# Patient Record
Sex: Female | Born: 1965
Health system: Southern US, Community
[De-identification: ages and names within clinical notes are randomized; demographics above are authoritative.]

## PROBLEM LIST (undated history)

## (undated) DIAGNOSIS — L309 Dermatitis, unspecified: Secondary | ICD-10-CM

## (undated) DIAGNOSIS — K219 Gastro-esophageal reflux disease without esophagitis: Secondary | ICD-10-CM

## (undated) DIAGNOSIS — G479 Sleep disorder, unspecified: Secondary | ICD-10-CM

## (undated) DIAGNOSIS — M722 Plantar fascial fibromatosis: Secondary | ICD-10-CM

## (undated) DIAGNOSIS — R002 Palpitations: Secondary | ICD-10-CM

## (undated) DIAGNOSIS — I4949 Other premature depolarization: Secondary | ICD-10-CM

## (undated) DIAGNOSIS — R51 Headache: Secondary | ICD-10-CM

## (undated) DIAGNOSIS — F329 Major depressive disorder, single episode, unspecified: Secondary | ICD-10-CM

## (undated) DIAGNOSIS — K602 Anal fissure, unspecified: Secondary | ICD-10-CM

## (undated) DIAGNOSIS — IMO0002 Reserved for concepts with insufficient information to code with codable children: Secondary | ICD-10-CM

## (undated) DIAGNOSIS — E042 Nontoxic multinodular goiter: Secondary | ICD-10-CM

## (undated) DIAGNOSIS — E039 Hypothyroidism, unspecified: Secondary | ICD-10-CM

## (undated) DIAGNOSIS — K625 Hemorrhage of anus and rectum: Secondary | ICD-10-CM

## (undated) DIAGNOSIS — R0989 Other specified symptoms and signs involving the circulatory and respiratory systems: Secondary | ICD-10-CM

## (undated) DIAGNOSIS — F32A Depression, unspecified: Secondary | ICD-10-CM

## (undated) DIAGNOSIS — J309 Allergic rhinitis, unspecified: Secondary | ICD-10-CM

## (undated) DIAGNOSIS — H5789 Other specified disorders of eye and adnexa: Secondary | ICD-10-CM

## (undated) DIAGNOSIS — R0609 Other forms of dyspnea: Secondary | ICD-10-CM

## (undated) DIAGNOSIS — IMO0001 Reserved for inherently not codable concepts without codable children: Secondary | ICD-10-CM

## (undated) HISTORY — PX: COLONOSCOPY: SHX174

## (undated) HISTORY — DX: Hypothyroidism, unspecified: E03.9

## (undated) HISTORY — PX: NASAL TURBINATE REDUCTION: SHX2072

## (undated) HISTORY — DX: Reserved for inherently not codable concepts without codable children: IMO0001

## (undated) HISTORY — DX: Other premature depolarization: I49.49

## (undated) HISTORY — DX: Depression, unspecified: F32.A

## (undated) HISTORY — DX: Other forms of dyspnea: R06.09

## (undated) HISTORY — DX: Allergic rhinitis, unspecified: J30.9

## (undated) HISTORY — DX: Hemorrhage of anus and rectum: K62.5

## (undated) HISTORY — DX: Plantar fascial fibromatosis: M72.2

## (undated) HISTORY — DX: Palpitations: R00.2

## (undated) HISTORY — DX: Other specified disorders of eye and adnexa: H57.89

## (undated) HISTORY — DX: Other specified symptoms and signs involving the circulatory and respiratory systems: R09.89

## (undated) HISTORY — DX: Dermatitis, unspecified: L30.9

## (undated) HISTORY — DX: Gastro-esophageal reflux disease without esophagitis: K21.9

## (undated) HISTORY — PX: BRONCHOSCOPY: SUR163

## (undated) HISTORY — DX: Anal fissure, unspecified: K60.2

## (undated) HISTORY — DX: Nontoxic multinodular goiter: E04.2

## (undated) HISTORY — DX: Reserved for concepts with insufficient information to code with codable children: IMO0002

## (undated) HISTORY — DX: Sleep disorder, unspecified: G47.9

## (undated) HISTORY — DX: Major depressive disorder, single episode, unspecified: F32.9

## (undated) HISTORY — PX: CARPAL TUNNEL RELEASE: SHX101

## (undated) HISTORY — DX: Headache: R51

---

## 1997-12-22 ENCOUNTER — Other Ambulatory Visit: Admission: RE | Admit: 1997-12-22 | Discharge: 1997-12-22 | Payer: Self-pay | Admitting: Obstetrics and Gynecology

## 1998-05-10 ENCOUNTER — Ambulatory Visit (HOSPITAL_COMMUNITY): Admission: RE | Admit: 1998-05-10 | Discharge: 1998-05-10 | Payer: Self-pay | Admitting: Obstetrics and Gynecology

## 1998-06-18 ENCOUNTER — Inpatient Hospital Stay (HOSPITAL_COMMUNITY): Admission: AD | Admit: 1998-06-18 | Discharge: 1998-06-18 | Payer: Self-pay | Admitting: Obstetrics and Gynecology

## 1998-07-13 ENCOUNTER — Inpatient Hospital Stay (HOSPITAL_COMMUNITY): Admission: AD | Admit: 1998-07-13 | Discharge: 1998-07-16 | Payer: Self-pay | Admitting: Obstetrics and Gynecology

## 1998-07-16 ENCOUNTER — Encounter (HOSPITAL_COMMUNITY): Admission: RE | Admit: 1998-07-16 | Discharge: 1998-10-14 | Payer: Self-pay | Admitting: *Deleted

## 1998-08-23 ENCOUNTER — Other Ambulatory Visit: Admission: RE | Admit: 1998-08-23 | Discharge: 1998-08-23 | Payer: Self-pay | Admitting: Obstetrics and Gynecology

## 1999-09-20 ENCOUNTER — Other Ambulatory Visit: Admission: RE | Admit: 1999-09-20 | Discharge: 1999-09-20 | Payer: Self-pay | Admitting: Obstetrics and Gynecology

## 1999-11-02 ENCOUNTER — Emergency Department (HOSPITAL_COMMUNITY): Admission: EM | Admit: 1999-11-02 | Discharge: 1999-11-02 | Payer: Self-pay | Admitting: Emergency Medicine

## 1999-11-02 ENCOUNTER — Encounter: Payer: Self-pay | Admitting: Emergency Medicine

## 2000-09-08 ENCOUNTER — Other Ambulatory Visit: Admission: RE | Admit: 2000-09-08 | Discharge: 2000-09-08 | Payer: Self-pay | Admitting: Obstetrics and Gynecology

## 2000-09-27 ENCOUNTER — Encounter: Payer: Self-pay | Admitting: Obstetrics & Gynecology

## 2000-09-27 ENCOUNTER — Inpatient Hospital Stay (HOSPITAL_COMMUNITY): Admission: AD | Admit: 2000-09-27 | Discharge: 2000-09-27 | Payer: Self-pay | Admitting: Obstetrics & Gynecology

## 2000-10-13 ENCOUNTER — Other Ambulatory Visit: Admission: RE | Admit: 2000-10-13 | Discharge: 2000-10-13 | Payer: Self-pay | Admitting: Obstetrics and Gynecology

## 2001-09-08 ENCOUNTER — Other Ambulatory Visit: Admission: RE | Admit: 2001-09-08 | Discharge: 2001-09-08 | Payer: Self-pay | Admitting: Obstetrics and Gynecology

## 2002-07-12 ENCOUNTER — Encounter: Admission: RE | Admit: 2002-07-12 | Discharge: 2002-07-12 | Payer: Self-pay | Admitting: Family Medicine

## 2002-07-12 ENCOUNTER — Encounter: Payer: Self-pay | Admitting: Family Medicine

## 2002-09-21 ENCOUNTER — Other Ambulatory Visit: Admission: RE | Admit: 2002-09-21 | Discharge: 2002-09-21 | Payer: Self-pay | Admitting: Obstetrics and Gynecology

## 2005-05-06 ENCOUNTER — Other Ambulatory Visit: Admission: RE | Admit: 2005-05-06 | Discharge: 2005-05-06 | Payer: Self-pay | Admitting: Obstetrics and Gynecology

## 2005-10-04 ENCOUNTER — Emergency Department (HOSPITAL_COMMUNITY): Admission: EM | Admit: 2005-10-04 | Discharge: 2005-10-04 | Payer: Self-pay | Admitting: Emergency Medicine

## 2005-12-02 ENCOUNTER — Ambulatory Visit: Payer: Self-pay | Admitting: Family Medicine

## 2006-03-05 ENCOUNTER — Emergency Department (HOSPITAL_COMMUNITY): Admission: EM | Admit: 2006-03-05 | Discharge: 2006-03-05 | Payer: Self-pay | Admitting: Emergency Medicine

## 2006-07-26 ENCOUNTER — Emergency Department (HOSPITAL_COMMUNITY): Admission: EM | Admit: 2006-07-26 | Discharge: 2006-07-26 | Payer: Self-pay | Admitting: Family Medicine

## 2006-09-10 ENCOUNTER — Emergency Department (HOSPITAL_COMMUNITY): Admission: EM | Admit: 2006-09-10 | Discharge: 2006-09-10 | Payer: Self-pay | Admitting: Family Medicine

## 2007-02-12 ENCOUNTER — Emergency Department (HOSPITAL_COMMUNITY): Admission: EM | Admit: 2007-02-12 | Discharge: 2007-02-12 | Payer: Self-pay | Admitting: Family Medicine

## 2007-03-22 ENCOUNTER — Ambulatory Visit: Payer: Self-pay | Admitting: Internal Medicine

## 2007-03-22 DIAGNOSIS — R002 Palpitations: Secondary | ICD-10-CM

## 2007-03-22 DIAGNOSIS — R29898 Other symptoms and signs involving the musculoskeletal system: Secondary | ICD-10-CM | POA: Insufficient documentation

## 2007-03-22 DIAGNOSIS — R12 Heartburn: Secondary | ICD-10-CM | POA: Insufficient documentation

## 2007-03-22 HISTORY — DX: Palpitations: R00.2

## 2007-03-22 LAB — CONVERTED CEMR LAB
Thyroglobulin Ab: 49.2 (ref 0.0–60.0)
Thyroperoxidase Ab SerPl-aCnc: <25 (ref 0.0–60.0)
Vit D, 1,25-Dihydroxy: 40 (ref 30–89)

## 2007-03-29 LAB — CONVERTED CEMR LAB
ALT: 43 units/L — ABNORMAL HIGH (ref 0–35)
AST: 37 units/L (ref 0–37)
Albumin: 3.9 g/dL (ref 3.5–5.2)
Alkaline Phosphatase: 69 units/L (ref 39–117)
BUN: 10 mg/dL (ref 6–23)
Basophils Absolute: 0 10*3/uL (ref 0.0–0.1)
Basophils Relative: 0.1 % (ref 0.0–1.0)
Bilirubin, Direct: 0.1 mg/dL (ref 0.0–0.3)
CO2: 32 meq/L (ref 19–32)
Calcium: 9.8 mg/dL (ref 8.4–10.5)
Chloride: 109 meq/L (ref 96–112)
Cholesterol: 184 mg/dL (ref 0–200)
Creatinine, Ser: 0.7 mg/dL (ref 0.4–1.2)
Eosinophils Absolute: 0.1 10*3/uL (ref 0.0–0.6)
Eosinophils Relative: 2 % (ref 0.0–5.0)
Free T4: 0.8 ng/dL (ref 0.6–1.6)
GFR calc Af Amer: 118 mL/min
GFR calc non Af Amer: 98 mL/min
Glucose, Bld: 90 mg/dL (ref 70–99)
HCT: 41.7 % (ref 36.0–46.0)
HDL: 58.7 mg/dL (ref >39.0)
Hemoglobin: 14.5 g/dL (ref 12.0–15.0)
LDL Cholesterol: 121 mg/dL — ABNORMAL HIGH (ref 0–99)
Lymphocytes Relative: 42.4 % (ref 12.0–46.0)
MCHC: 34.7 g/dL (ref 30.0–36.0)
MCV: 88.2 fL (ref 78.0–100.0)
Monocytes Absolute: 0.6 10*3/uL (ref 0.2–0.7)
Monocytes Relative: 9.4 % (ref 3.0–11.0)
Neutro Abs: 2.7 10*3/uL (ref 1.4–7.7)
Neutrophils Relative %: 46.1 % (ref 43.0–77.0)
Platelets: 370 10*3/uL (ref 150–400)
Potassium: 5.1 meq/L (ref 3.5–5.1)
RBC: 4.73 M/uL (ref 3.87–5.11)
RDW: 12.1 % (ref 11.5–14.6)
Sodium: 146 meq/L — ABNORMAL HIGH (ref 135–145)
TSH: 0.28 microintl units/mL — ABNORMAL LOW (ref 0.35–5.50)
Total Bilirubin: 0.7 mg/dL (ref 0.3–1.2)
Total CHOL/HDL Ratio: 3.1
Total Protein: 6.8 g/dL (ref 6.0–8.3)
Triglycerides: 21 mg/dL (ref 0–149)
VLDL: 4 mg/dL (ref 0–40)
WBC: 5.9 10*3/uL (ref 4.5–10.5)

## 2007-04-12 ENCOUNTER — Encounter: Payer: Self-pay | Admitting: Internal Medicine

## 2007-04-12 ENCOUNTER — Ambulatory Visit: Payer: Self-pay | Admitting: Cardiology

## 2007-07-30 ENCOUNTER — Ambulatory Visit: Payer: Self-pay | Admitting: Internal Medicine

## 2007-07-30 DIAGNOSIS — F341 Dysthymic disorder: Secondary | ICD-10-CM | POA: Insufficient documentation

## 2007-08-06 LAB — CONVERTED CEMR LAB
Free T4: 1.12 ng/dL (ref 0.89–1.80)
TSH: 0.283 microintl units/mL — ABNORMAL LOW (ref 0.350–5.50)

## 2007-08-16 ENCOUNTER — Ambulatory Visit: Payer: Self-pay | Admitting: Endocrinology

## 2007-08-16 DIAGNOSIS — E042 Nontoxic multinodular goiter: Secondary | ICD-10-CM | POA: Insufficient documentation

## 2007-08-16 DIAGNOSIS — F329 Major depressive disorder, single episode, unspecified: Secondary | ICD-10-CM | POA: Insufficient documentation

## 2007-08-23 ENCOUNTER — Encounter: Admission: RE | Admit: 2007-08-23 | Discharge: 2007-08-23 | Payer: Self-pay | Admitting: Endocrinology

## 2007-08-26 ENCOUNTER — Telehealth: Payer: Self-pay | Admitting: Endocrinology

## 2007-08-30 ENCOUNTER — Encounter: Admission: RE | Admit: 2007-08-30 | Discharge: 2007-08-30 | Payer: Self-pay | Admitting: Endocrinology

## 2007-09-01 ENCOUNTER — Encounter: Payer: Self-pay | Admitting: Internal Medicine

## 2007-09-01 ENCOUNTER — Encounter: Payer: Self-pay | Admitting: Endocrinology

## 2007-09-01 ENCOUNTER — Other Ambulatory Visit: Admission: RE | Admit: 2007-09-01 | Discharge: 2007-09-01 | Payer: Self-pay | Admitting: Interventional Radiology

## 2007-09-01 ENCOUNTER — Encounter: Admission: RE | Admit: 2007-09-01 | Discharge: 2007-09-01 | Payer: Self-pay | Admitting: Endocrinology

## 2007-09-01 ENCOUNTER — Encounter (INDEPENDENT_AMBULATORY_CARE_PROVIDER_SITE_OTHER): Payer: Self-pay | Admitting: Interventional Radiology

## 2007-09-07 ENCOUNTER — Telehealth: Payer: Self-pay | Admitting: Endocrinology

## 2007-09-07 ENCOUNTER — Encounter: Payer: Self-pay | Admitting: Internal Medicine

## 2007-09-13 ENCOUNTER — Telehealth: Payer: Self-pay | Admitting: Endocrinology

## 2007-09-24 ENCOUNTER — Encounter: Admission: RE | Admit: 2007-09-24 | Discharge: 2007-09-24 | Payer: Self-pay | Admitting: Endocrinology

## 2007-09-29 ENCOUNTER — Telehealth (INDEPENDENT_AMBULATORY_CARE_PROVIDER_SITE_OTHER): Payer: Self-pay | Admitting: *Deleted

## 2007-10-05 ENCOUNTER — Encounter: Admission: RE | Admit: 2007-10-05 | Discharge: 2007-10-05 | Payer: Self-pay | Admitting: Internal Medicine

## 2007-10-06 ENCOUNTER — Encounter: Payer: Self-pay | Admitting: Internal Medicine

## 2007-10-06 ENCOUNTER — Ambulatory Visit: Payer: Self-pay | Admitting: Internal Medicine

## 2007-10-06 ENCOUNTER — Other Ambulatory Visit: Admission: RE | Admit: 2007-10-06 | Discharge: 2007-10-06 | Payer: Self-pay | Admitting: Internal Medicine

## 2007-10-06 DIAGNOSIS — K625 Hemorrhage of anus and rectum: Secondary | ICD-10-CM | POA: Insufficient documentation

## 2007-10-06 HISTORY — DX: Hemorrhage of anus and rectum: K62.5

## 2007-10-13 ENCOUNTER — Telehealth: Payer: Self-pay | Admitting: *Deleted

## 2007-11-08 ENCOUNTER — Ambulatory Visit: Payer: Self-pay | Admitting: Endocrinology

## 2007-11-08 LAB — CONVERTED CEMR LAB
Free T4: 1.6 ng/dL (ref 0.6–1.6)
TSH: 0.06 microintl units/mL — ABNORMAL LOW (ref 0.35–5.50)

## 2007-12-03 ENCOUNTER — Ambulatory Visit: Payer: Self-pay | Admitting: Internal Medicine

## 2007-12-03 DIAGNOSIS — K602 Anal fissure, unspecified: Secondary | ICD-10-CM | POA: Insufficient documentation

## 2007-12-03 HISTORY — DX: Anal fissure, unspecified: K60.2

## 2008-01-03 ENCOUNTER — Ambulatory Visit: Payer: Self-pay | Admitting: Endocrinology

## 2008-01-03 DIAGNOSIS — E890 Postprocedural hypothyroidism: Secondary | ICD-10-CM | POA: Insufficient documentation

## 2008-01-03 LAB — CONVERTED CEMR LAB
Free T4: 0.4 ng/dL — ABNORMAL LOW (ref 0.6–1.6)
TSH: 7.08 microintl units/mL — ABNORMAL HIGH (ref 0.35–5.50)

## 2008-01-05 ENCOUNTER — Telehealth (INDEPENDENT_AMBULATORY_CARE_PROVIDER_SITE_OTHER): Payer: Self-pay | Admitting: *Deleted

## 2008-01-11 ENCOUNTER — Telehealth (INDEPENDENT_AMBULATORY_CARE_PROVIDER_SITE_OTHER): Payer: Self-pay | Admitting: *Deleted

## 2008-01-20 ENCOUNTER — Ambulatory Visit: Payer: Self-pay | Admitting: Endocrinology

## 2008-01-20 LAB — CONVERTED CEMR LAB: TSH: 10.46 microintl units/mL — ABNORMAL HIGH (ref 0.35–5.50)

## 2008-01-26 ENCOUNTER — Ambulatory Visit: Payer: Self-pay | Admitting: Internal Medicine

## 2008-01-26 DIAGNOSIS — M549 Dorsalgia, unspecified: Secondary | ICD-10-CM | POA: Insufficient documentation

## 2008-01-26 LAB — CONVERTED CEMR LAB
Bilirubin Urine: NEGATIVE
Blood in Urine, dipstick: NEGATIVE
Glucose, Urine, Semiquant: NEGATIVE
Nitrite: NEGATIVE
Protein, U semiquant: NEGATIVE
Specific Gravity, Urine: 1.01
Urobilinogen, UA: 0.2
WBC Urine, dipstick: NEGATIVE
pH: 7

## 2008-01-27 ENCOUNTER — Encounter: Payer: Self-pay | Admitting: Internal Medicine

## 2008-02-02 ENCOUNTER — Telehealth: Payer: Self-pay | Admitting: *Deleted

## 2008-02-08 ENCOUNTER — Ambulatory Visit: Payer: Self-pay | Admitting: Endocrinology

## 2008-02-08 LAB — CONVERTED CEMR LAB: TSH: 2.34 microintl units/mL (ref 0.35–5.50)

## 2008-02-09 ENCOUNTER — Telehealth: Payer: Self-pay | Admitting: Endocrinology

## 2008-02-11 ENCOUNTER — Ambulatory Visit: Payer: Self-pay | Admitting: Internal Medicine

## 2008-02-11 DIAGNOSIS — N390 Urinary tract infection, site not specified: Secondary | ICD-10-CM | POA: Insufficient documentation

## 2008-02-11 LAB — CONVERTED CEMR LAB
Bilirubin Urine: NEGATIVE
Blood in Urine, dipstick: NEGATIVE
Glucose, Urine, Semiquant: NEGATIVE
Ketones, urine, test strip: NEGATIVE
Nitrite: NEGATIVE
Protein, U semiquant: NEGATIVE
Specific Gravity, Urine: 1.015
Urobilinogen, UA: 0.2
WBC Urine, dipstick: NEGATIVE
pH: 7

## 2008-02-12 ENCOUNTER — Encounter: Payer: Self-pay | Admitting: Internal Medicine

## 2008-02-16 ENCOUNTER — Telehealth: Payer: Self-pay | Admitting: Internal Medicine

## 2008-02-18 ENCOUNTER — Telehealth: Payer: Self-pay | Admitting: Internal Medicine

## 2008-02-21 ENCOUNTER — Encounter: Payer: Self-pay | Admitting: Internal Medicine

## 2008-02-21 ENCOUNTER — Ambulatory Visit: Payer: Self-pay | Admitting: Internal Medicine

## 2008-02-21 LAB — HM COLONOSCOPY

## 2008-02-22 ENCOUNTER — Encounter: Payer: Self-pay | Admitting: Internal Medicine

## 2008-02-24 ENCOUNTER — Telehealth: Payer: Self-pay | Admitting: Internal Medicine

## 2008-03-01 ENCOUNTER — Telehealth: Payer: Self-pay | Admitting: Internal Medicine

## 2008-03-02 ENCOUNTER — Ambulatory Visit: Payer: Self-pay | Admitting: Internal Medicine

## 2008-03-02 LAB — CONVERTED CEMR LAB
Bilirubin Urine: NEGATIVE
Hemoglobin, Urine: NEGATIVE
Ketones, ur: NEGATIVE mg/dL
Leukocytes, UA: NEGATIVE
Nitrite: NEGATIVE
Specific Gravity, Urine: 1.01 (ref 1.000–1.03)
Total Protein, Urine: NEGATIVE mg/dL
Urine Glucose: NEGATIVE mg/dL
Urobilinogen, UA: 0.2 (ref 0.0–1.0)
pH: 6.5 (ref 5.0–8.0)

## 2008-03-06 ENCOUNTER — Telehealth: Payer: Self-pay | Admitting: Internal Medicine

## 2008-03-06 ENCOUNTER — Telehealth: Payer: Self-pay | Admitting: Family Medicine

## 2008-03-06 ENCOUNTER — Ambulatory Visit: Payer: Self-pay | Admitting: Internal Medicine

## 2008-03-15 ENCOUNTER — Ambulatory Visit: Payer: Self-pay | Admitting: Internal Medicine

## 2008-03-15 ENCOUNTER — Telehealth: Payer: Self-pay | Admitting: Internal Medicine

## 2008-03-15 LAB — CONVERTED CEMR LAB
Bilirubin Urine: NEGATIVE
Crystals: NEGATIVE
Ketones, ur: NEGATIVE mg/dL
Leukocytes, UA: NEGATIVE
Mucus, UA: NEGATIVE
Nitrite: NEGATIVE
Specific Gravity, Urine: 1.01 (ref 1.000–1.03)
Total Protein, Urine: NEGATIVE mg/dL
Urine Glucose: NEGATIVE mg/dL
Urobilinogen, UA: 0.2 (ref 0.0–1.0)
pH: 7 (ref 5.0–8.0)

## 2008-03-21 ENCOUNTER — Telehealth: Payer: Self-pay | Admitting: Internal Medicine

## 2008-03-22 ENCOUNTER — Telehealth: Payer: Self-pay | Admitting: Internal Medicine

## 2008-03-27 ENCOUNTER — Ambulatory Visit: Payer: Self-pay | Admitting: Endocrinology

## 2008-03-27 LAB — CONVERTED CEMR LAB: TSH: 1.14 microintl units/mL (ref 0.35–5.50)

## 2008-05-29 ENCOUNTER — Telehealth: Payer: Self-pay | Admitting: Endocrinology

## 2008-06-05 ENCOUNTER — Ambulatory Visit: Payer: Self-pay | Admitting: Endocrinology

## 2008-06-05 LAB — CONVERTED CEMR LAB: TSH: 0.97 microintl units/mL (ref 0.35–5.50)

## 2008-06-06 ENCOUNTER — Telehealth (INDEPENDENT_AMBULATORY_CARE_PROVIDER_SITE_OTHER): Payer: Self-pay | Admitting: *Deleted

## 2008-07-25 ENCOUNTER — Ambulatory Visit: Payer: Self-pay | Admitting: Internal Medicine

## 2008-07-25 LAB — CONVERTED CEMR LAB
Bilirubin Urine: NEGATIVE
Glucose, Urine, Semiquant: NEGATIVE
Ketones, urine, test strip: NEGATIVE
Nitrite: NEGATIVE
Protein, U semiquant: NEGATIVE
Specific Gravity, Urine: 1.005
Urobilinogen, UA: 0.2
WBC Urine, dipstick: NEGATIVE
pH: 7

## 2008-08-07 ENCOUNTER — Encounter: Admission: RE | Admit: 2008-08-07 | Discharge: 2008-08-07 | Payer: Self-pay | Admitting: Internal Medicine

## 2008-08-08 ENCOUNTER — Telehealth: Payer: Self-pay | Admitting: Internal Medicine

## 2008-08-08 ENCOUNTER — Telehealth: Payer: Self-pay | Admitting: *Deleted

## 2008-08-08 ENCOUNTER — Emergency Department (HOSPITAL_COMMUNITY): Admission: EM | Admit: 2008-08-08 | Discharge: 2008-08-08 | Payer: Self-pay | Admitting: Family Medicine

## 2008-08-15 ENCOUNTER — Ambulatory Visit: Payer: Self-pay | Admitting: Internal Medicine

## 2008-08-15 DIAGNOSIS — S139XXA Sprain of joints and ligaments of unspecified parts of neck, initial encounter: Secondary | ICD-10-CM | POA: Insufficient documentation

## 2008-08-16 ENCOUNTER — Ambulatory Visit: Payer: Self-pay | Admitting: Internal Medicine

## 2008-08-17 ENCOUNTER — Telehealth (INDEPENDENT_AMBULATORY_CARE_PROVIDER_SITE_OTHER): Payer: Self-pay | Admitting: *Deleted

## 2008-08-17 ENCOUNTER — Telehealth: Payer: Self-pay | Admitting: Internal Medicine

## 2008-08-28 ENCOUNTER — Telehealth: Payer: Self-pay | Admitting: *Deleted

## 2008-08-28 ENCOUNTER — Telehealth: Payer: Self-pay | Admitting: Internal Medicine

## 2008-09-01 ENCOUNTER — Telehealth: Payer: Self-pay | Admitting: Internal Medicine

## 2008-09-06 ENCOUNTER — Encounter: Payer: Self-pay | Admitting: Internal Medicine

## 2008-09-18 ENCOUNTER — Encounter: Payer: Self-pay | Admitting: Internal Medicine

## 2008-09-27 ENCOUNTER — Telehealth (INDEPENDENT_AMBULATORY_CARE_PROVIDER_SITE_OTHER): Payer: Self-pay | Admitting: *Deleted

## 2008-10-04 ENCOUNTER — Ambulatory Visit: Payer: Self-pay | Admitting: Internal Medicine

## 2008-10-05 ENCOUNTER — Encounter: Admission: RE | Admit: 2008-10-05 | Discharge: 2008-10-12 | Payer: Self-pay | Admitting: Internal Medicine

## 2008-10-05 ENCOUNTER — Ambulatory Visit (HOSPITAL_BASED_OUTPATIENT_CLINIC_OR_DEPARTMENT_OTHER): Admission: RE | Admit: 2008-10-05 | Discharge: 2008-10-05 | Payer: Self-pay | Admitting: Orthopedic Surgery

## 2008-10-06 ENCOUNTER — Telehealth: Payer: Self-pay | Admitting: Internal Medicine

## 2008-10-12 ENCOUNTER — Encounter: Payer: Self-pay | Admitting: Internal Medicine

## 2008-10-16 ENCOUNTER — Ambulatory Visit: Payer: Self-pay | Admitting: Endocrinology

## 2008-10-16 DIAGNOSIS — R519 Headache, unspecified: Secondary | ICD-10-CM | POA: Insufficient documentation

## 2008-10-16 DIAGNOSIS — R51 Headache: Secondary | ICD-10-CM | POA: Insufficient documentation

## 2008-10-16 HISTORY — DX: Headache: R51

## 2008-10-16 LAB — CONVERTED CEMR LAB: TSH: 0.22 microintl units/mL — ABNORMAL LOW (ref 0.35–5.50)

## 2008-10-18 ENCOUNTER — Telehealth (INDEPENDENT_AMBULATORY_CARE_PROVIDER_SITE_OTHER): Payer: Self-pay | Admitting: *Deleted

## 2008-10-30 ENCOUNTER — Encounter: Payer: Self-pay | Admitting: Endocrinology

## 2008-11-01 ENCOUNTER — Telehealth: Payer: Self-pay | Admitting: Endocrinology

## 2008-11-06 ENCOUNTER — Telehealth: Payer: Self-pay | Admitting: Endocrinology

## 2008-11-13 ENCOUNTER — Ambulatory Visit (HOSPITAL_COMMUNITY): Admission: RE | Admit: 2008-11-13 | Discharge: 2008-11-13 | Payer: Self-pay | Admitting: Sports Medicine

## 2008-11-20 ENCOUNTER — Ambulatory Visit: Payer: Self-pay | Admitting: Endocrinology

## 2008-11-20 ENCOUNTER — Telehealth (INDEPENDENT_AMBULATORY_CARE_PROVIDER_SITE_OTHER): Payer: Self-pay | Admitting: *Deleted

## 2008-11-20 LAB — CONVERTED CEMR LAB: TSH: 6.21 microintl units/mL — ABNORMAL HIGH (ref 0.35–5.50)

## 2008-11-24 ENCOUNTER — Encounter: Admission: RE | Admit: 2008-11-24 | Discharge: 2008-11-24 | Payer: Self-pay | Admitting: Endocrinology

## 2009-01-09 ENCOUNTER — Ambulatory Visit: Payer: Self-pay | Admitting: Endocrinology

## 2009-01-10 ENCOUNTER — Telehealth: Payer: Self-pay | Admitting: Endocrinology

## 2009-01-10 ENCOUNTER — Encounter: Admission: RE | Admit: 2009-01-10 | Discharge: 2009-01-10 | Payer: Self-pay | Admitting: Internal Medicine

## 2009-01-10 LAB — CONVERTED CEMR LAB: TSH: 4.67 microintl units/mL (ref 0.35–5.50)

## 2009-01-15 ENCOUNTER — Ambulatory Visit: Payer: Self-pay | Admitting: Internal Medicine

## 2009-01-15 LAB — CONVERTED CEMR LAB
ALT: 21 units/L (ref 0–35)
AST: 23 units/L (ref 0–37)
Albumin: 3.5 g/dL (ref 3.5–5.2)
Alkaline Phosphatase: 69 units/L (ref 39–117)
BUN: 14 mg/dL (ref 6–23)
Basophils Absolute: 0.1 10*3/uL (ref 0.0–0.1)
Basophils Relative: 0.9 % (ref 0.0–3.0)
Bilirubin Urine: NEGATIVE
Bilirubin, Direct: 0 mg/dL (ref 0.0–0.3)
CO2: 28 meq/L (ref 19–32)
Calcium: 8.5 mg/dL (ref 8.4–10.5)
Chloride: 105 meq/L (ref 96–112)
Cholesterol: 163 mg/dL (ref 0–200)
Creatinine, Ser: 0.5 mg/dL (ref 0.4–1.2)
Eosinophils Absolute: 0.1 10*3/uL (ref 0.0–0.7)
Eosinophils Relative: 1.9 % (ref 0.0–5.0)
GFR calc non Af Amer: 142.5 mL/min (ref >60)
Glucose, Bld: 90 mg/dL (ref 70–99)
HCT: 40.4 % (ref 36.0–46.0)
HDL: 68 mg/dL (ref >39.00)
Hemoglobin: 13.8 g/dL (ref 12.0–15.0)
Ketones, ur: NEGATIVE mg/dL
LDL Cholesterol: 88 mg/dL (ref 0–99)
Leukocytes, UA: NEGATIVE
Lymphocytes Relative: 32.7 % (ref 12.0–46.0)
Lymphs Abs: 1.9 10*3/uL (ref 0.7–4.0)
MCHC: 34.1 g/dL (ref 30.0–36.0)
MCV: 91.8 fL (ref 78.0–100.0)
Monocytes Absolute: 0.5 10*3/uL (ref 0.1–1.0)
Monocytes Relative: 7.7 % (ref 3.0–12.0)
Neutro Abs: 3.3 10*3/uL (ref 1.4–7.7)
Neutrophils Relative %: 56.8 % (ref 43.0–77.0)
Nitrite: NEGATIVE
Platelets: 230 10*3/uL (ref 150.0–400.0)
Potassium: 3.9 meq/L (ref 3.5–5.1)
RBC: 4.41 M/uL (ref 3.87–5.11)
RDW: 12.9 % (ref 11.5–14.6)
Sodium: 138 meq/L (ref 135–145)
Specific Gravity, Urine: 1.01 (ref 1.000–1.030)
TSH: 3.86 microintl units/mL (ref 0.35–5.50)
Total Bilirubin: 0.7 mg/dL (ref 0.3–1.2)
Total CHOL/HDL Ratio: 2
Total Protein, Urine: NEGATIVE mg/dL
Total Protein: 6.5 g/dL (ref 6.0–8.3)
Triglycerides: 34 mg/dL (ref 0.0–149.0)
Urine Glucose: NEGATIVE mg/dL
Urobilinogen, UA: 0.2 (ref 0.0–1.0)
VLDL: 6.8 mg/dL (ref 0.0–40.0)
WBC: 5.9 10*3/uL (ref 4.5–10.5)
pH: 7.5 (ref 5.0–8.0)

## 2009-01-22 ENCOUNTER — Ambulatory Visit: Payer: Self-pay | Admitting: Internal Medicine

## 2009-02-01 ENCOUNTER — Telehealth: Payer: Self-pay | Admitting: Endocrinology

## 2009-02-12 ENCOUNTER — Ambulatory Visit: Payer: Self-pay | Admitting: Internal Medicine

## 2009-02-12 DIAGNOSIS — R0989 Other specified symptoms and signs involving the circulatory and respiratory systems: Secondary | ICD-10-CM | POA: Insufficient documentation

## 2009-02-12 DIAGNOSIS — R0609 Other forms of dyspnea: Secondary | ICD-10-CM | POA: Insufficient documentation

## 2009-02-12 HISTORY — DX: Other forms of dyspnea: R06.09

## 2009-02-12 HISTORY — DX: Other specified symptoms and signs involving the circulatory and respiratory systems: R09.89

## 2009-03-05 ENCOUNTER — Encounter: Payer: Self-pay | Admitting: Internal Medicine

## 2009-03-13 ENCOUNTER — Ambulatory Visit: Payer: Self-pay | Admitting: Internal Medicine

## 2009-04-12 ENCOUNTER — Ambulatory Visit: Payer: Self-pay | Admitting: Internal Medicine

## 2009-04-16 LAB — CONVERTED CEMR LAB
Free T4: 0.6 ng/dL (ref 0.6–1.6)
T3, Free: 3 pg/mL (ref 2.3–4.2)
TSH: 2.64 microintl units/mL (ref 0.35–5.50)

## 2009-07-16 ENCOUNTER — Ambulatory Visit: Payer: Self-pay | Admitting: Internal Medicine

## 2009-07-16 DIAGNOSIS — G4700 Insomnia, unspecified: Secondary | ICD-10-CM | POA: Insufficient documentation

## 2009-09-07 IMAGING — NM NM RAI THERAPY FOR HYPERTHYROIDISM
1 series · 1 of 1 positions shown · non-contrast
Comparison: None

CLINICAL DATA: Hyperthyroidism.

NUCLEAR MEDICINE RADIOACTIVE IODINE THERAPY FOR HYPERTHYROIDISM
TECHNIQUE: The risks and benefits of radioactive iodine therapy
were discussed with the patient in detail. Alternative therapies
were also mentioned. Radiation safety was discussed with the
patient, including how to protect the general public from exposure.
There were no barriers to communication.  Written consent was
obtained.  The patient then received a capsule containing the
radiopharmaceutical.  The patient will follow-up with the referring
physician.
Radiopharmaceutical: 30.6 mCi I 131 sodium iodide orally.

[st statics, dual detec · 2.33mm/px · 1 of 1 slices shown]
[im 1/1]
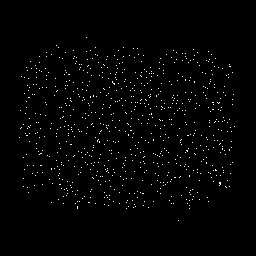

[1 of 1 positions shown; findings below may reference images not displayed]

IMPRESSION: As above.

## 2009-09-18 IMAGING — MG MM SCREEN MAMMOGRAM BILATERAL
4 series · 4 of 4 positions shown · non-contrast
Comparison: none

DG SCREEN MAMMOGRAM BILATERAL
Bilateral CC and MLO view(s) were taken.
Technologist: Seisho Sotashe

DIGITAL SCREENING MAMMOGRAM WITH CAD:
The breast tissue is almost entirely fatty.  No masses or malignant type calcifications are 
identified.  Compared with prior studies.

[R CC]
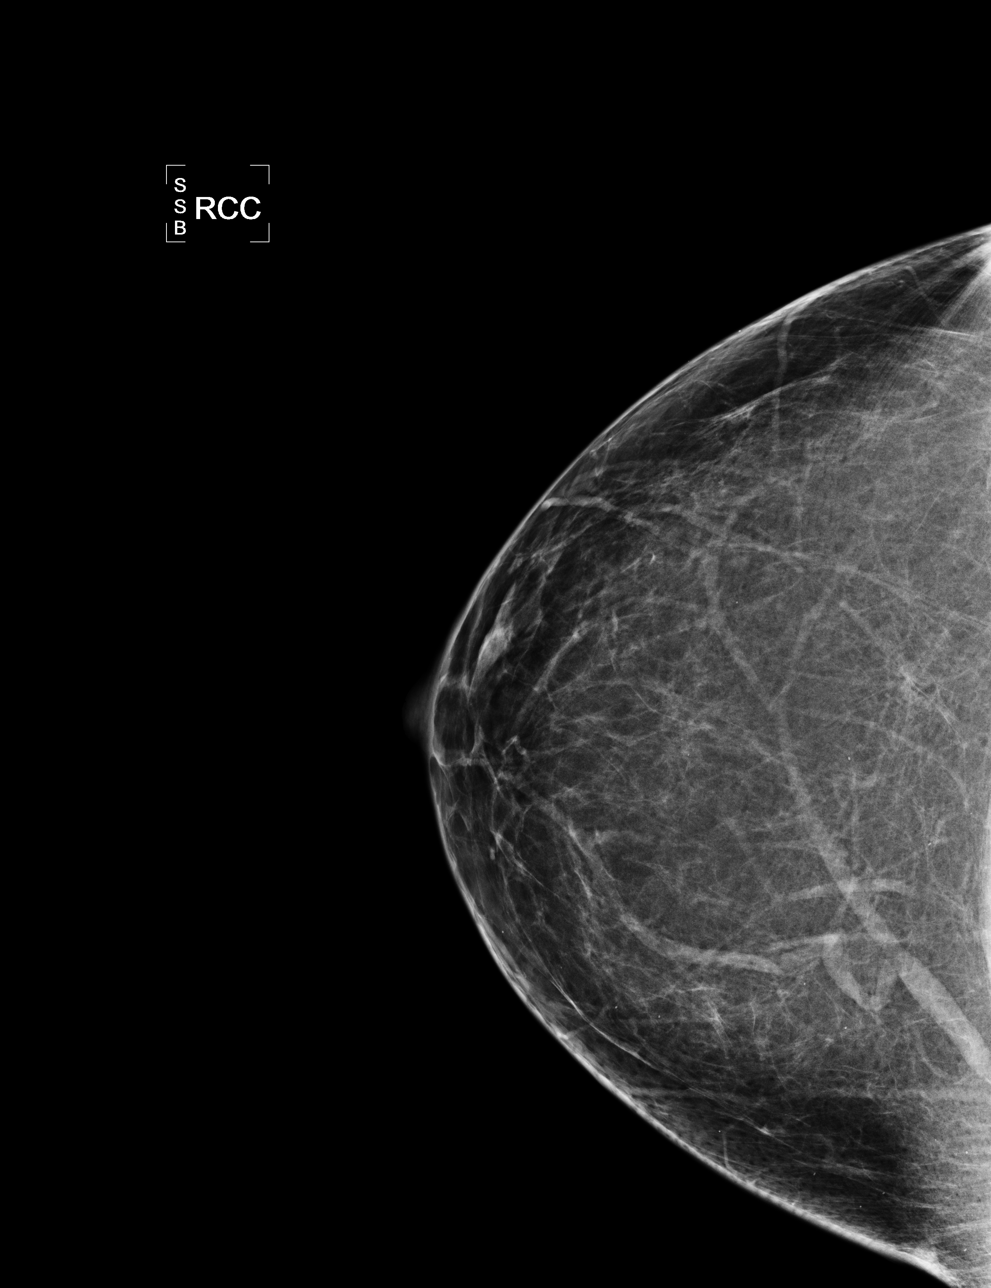

[L CC]
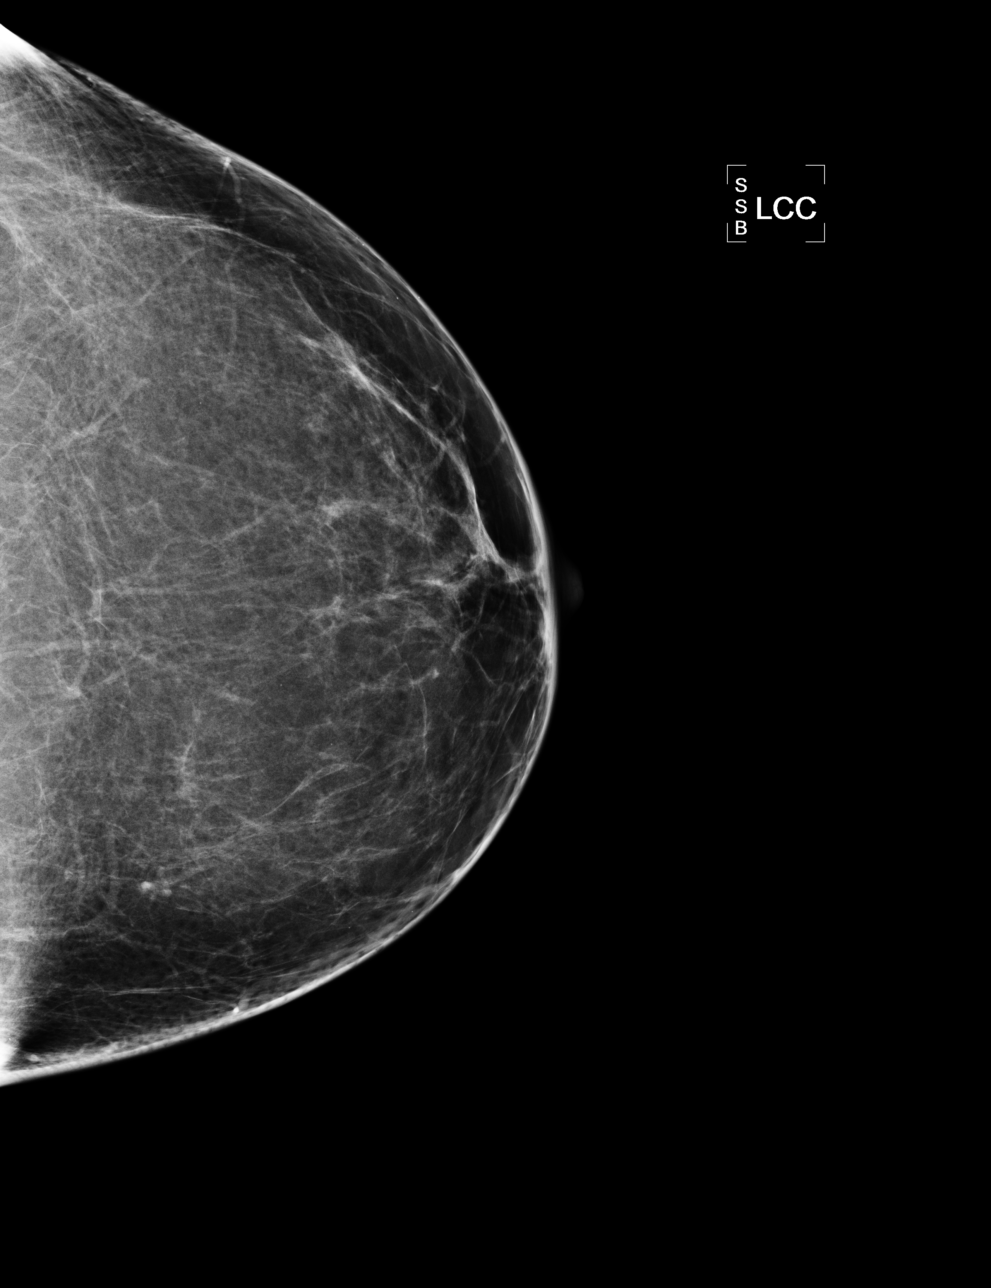

[L MLO]
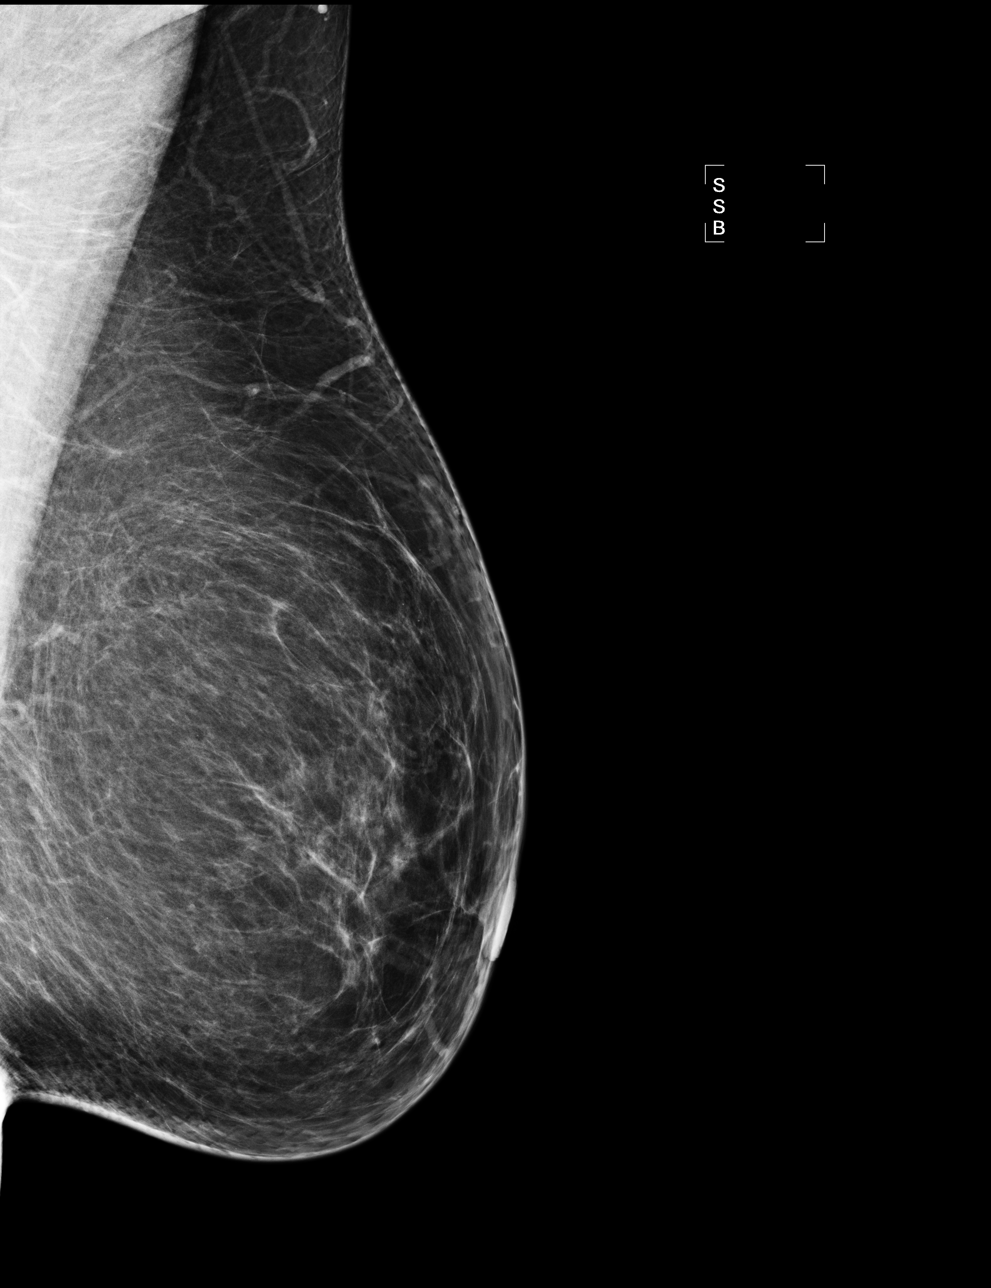

[R MLO]
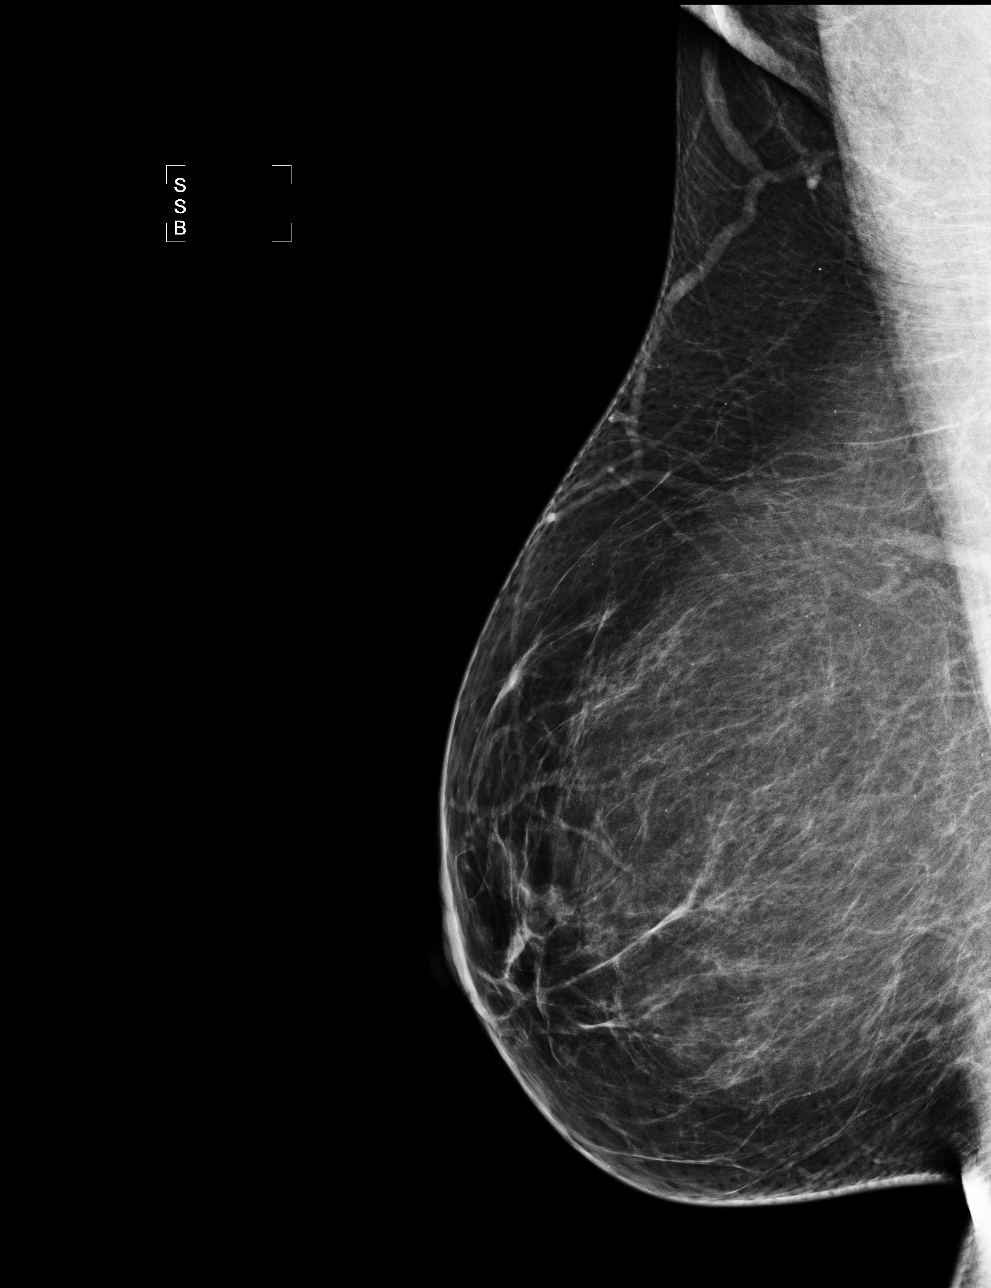

[4 of 4 positions shown; findings below may reference images not displayed]

IMPRESSION: No specific mammographic evidence of malignancy.  Next screening mammogram is recommended in one 
year.

ASSESSMENT: Negative - BI-RADS 1

Screening mammogram in 1 year.
ANALYZED BY COMPUTER AIDED DETECTION. , THIS PROCEDURE WAS A DIGITAL MAMMOGRAM.

## 2009-11-16 ENCOUNTER — Telehealth: Payer: Self-pay | Admitting: Internal Medicine

## 2009-11-21 ENCOUNTER — Ambulatory Visit: Payer: Self-pay | Admitting: Internal Medicine

## 2009-11-23 LAB — CONVERTED CEMR LAB: TSH: 2.77 microintl units/mL (ref 0.35–5.50)

## 2009-12-31 ENCOUNTER — Telehealth: Payer: Self-pay | Admitting: *Deleted

## 2010-01-14 ENCOUNTER — Encounter: Admission: RE | Admit: 2010-01-14 | Discharge: 2010-01-14 | Payer: Self-pay | Admitting: Internal Medicine

## 2010-01-14 LAB — HM MAMMOGRAPHY

## 2010-01-21 ENCOUNTER — Ambulatory Visit: Payer: Self-pay | Admitting: Pulmonary Disease

## 2010-01-21 ENCOUNTER — Ambulatory Visit: Payer: Self-pay | Admitting: Internal Medicine

## 2010-01-21 DIAGNOSIS — G478 Other sleep disorders: Secondary | ICD-10-CM | POA: Insufficient documentation

## 2010-01-21 LAB — CONVERTED CEMR LAB
ALT: 16 units/L (ref 0–35)
AST: 17 units/L (ref 0–37)
Albumin: 3.9 g/dL (ref 3.5–5.2)
Alkaline Phosphatase: 62 units/L (ref 39–117)
BUN: 9 mg/dL (ref 6–23)
Basophils Absolute: 0 10*3/uL (ref 0.0–0.1)
Basophils Relative: 0.3 % (ref 0.0–3.0)
Bilirubin Urine: NEGATIVE
Bilirubin, Direct: 0.1 mg/dL (ref 0.0–0.3)
CO2: 27 meq/L (ref 19–32)
Calcium: 9 mg/dL (ref 8.4–10.5)
Chloride: 104 meq/L (ref 96–112)
Cholesterol: 161 mg/dL (ref 0–200)
Creatinine, Ser: 0.6 mg/dL (ref 0.4–1.2)
Eosinophils Absolute: 0.1 10*3/uL (ref 0.0–0.7)
Eosinophils Relative: 1.3 % (ref 0.0–5.0)
GFR calc non Af Amer: 114.92 mL/min (ref >60)
Glucose, Bld: 86 mg/dL (ref 70–99)
HCT: 40.3 % (ref 36.0–46.0)
HDL: 69.4 mg/dL (ref >39.00)
Hemoglobin: 14 g/dL (ref 12.0–15.0)
Ketones, ur: NEGATIVE mg/dL
LDL Cholesterol: 85 mg/dL (ref 0–99)
Leukocytes, UA: NEGATIVE
Lymphocytes Relative: 34 % (ref 12.0–46.0)
Lymphs Abs: 1.9 10*3/uL (ref 0.7–4.0)
MCHC: 34.7 g/dL (ref 30.0–36.0)
MCV: 88.9 fL (ref 78.0–100.0)
Monocytes Absolute: 0.5 10*3/uL (ref 0.1–1.0)
Monocytes Relative: 9.5 % (ref 3.0–12.0)
Neutro Abs: 3 10*3/uL (ref 1.4–7.7)
Neutrophils Relative %: 54.9 % (ref 43.0–77.0)
Nitrite: NEGATIVE
Platelets: 252 10*3/uL (ref 150.0–400.0)
Potassium: 4.2 meq/L (ref 3.5–5.1)
RBC: 4.53 M/uL (ref 3.87–5.11)
RDW: 13.8 % (ref 11.5–14.6)
Sodium: 140 meq/L (ref 135–145)
Specific Gravity, Urine: <=1.005 (ref 1.000–1.030)
TSH: 4.21 microintl units/mL (ref 0.35–5.50)
Total Bilirubin: 0.4 mg/dL (ref 0.3–1.2)
Total CHOL/HDL Ratio: 2
Total Protein, Urine: NEGATIVE mg/dL
Total Protein: 6.6 g/dL (ref 6.0–8.3)
Triglycerides: 34 mg/dL (ref 0.0–149.0)
Urine Glucose: NEGATIVE mg/dL
Urobilinogen, UA: 0.2 (ref 0.0–1.0)
VLDL: 6.8 mg/dL (ref 0.0–40.0)
WBC: 5.5 10*3/uL (ref 4.5–10.5)
pH: 6.5 (ref 5.0–8.0)

## 2010-02-04 ENCOUNTER — Other Ambulatory Visit: Admission: RE | Admit: 2010-02-04 | Discharge: 2010-02-04 | Payer: Self-pay | Admitting: Internal Medicine

## 2010-02-04 ENCOUNTER — Ambulatory Visit: Payer: Self-pay | Admitting: Internal Medicine

## 2010-02-04 DIAGNOSIS — IMO0002 Reserved for concepts with insufficient information to code with codable children: Secondary | ICD-10-CM | POA: Insufficient documentation

## 2010-02-04 DIAGNOSIS — L259 Unspecified contact dermatitis, unspecified cause: Secondary | ICD-10-CM | POA: Insufficient documentation

## 2010-02-04 LAB — HM PAP SMEAR

## 2010-02-07 LAB — CONVERTED CEMR LAB: Pap Smear: NEGATIVE

## 2010-02-10 ENCOUNTER — Encounter: Payer: Self-pay | Admitting: Pulmonary Disease

## 2010-02-10 ENCOUNTER — Ambulatory Visit (HOSPITAL_BASED_OUTPATIENT_CLINIC_OR_DEPARTMENT_OTHER): Admission: RE | Admit: 2010-02-10 | Discharge: 2010-02-10 | Payer: Self-pay | Admitting: Pulmonary Disease

## 2010-02-22 ENCOUNTER — Telehealth (INDEPENDENT_AMBULATORY_CARE_PROVIDER_SITE_OTHER): Payer: Self-pay | Admitting: *Deleted

## 2010-02-22 ENCOUNTER — Ambulatory Visit: Payer: Self-pay | Admitting: Pulmonary Disease

## 2010-03-04 ENCOUNTER — Ambulatory Visit: Payer: Self-pay | Admitting: Pulmonary Disease

## 2010-04-29 ENCOUNTER — Ambulatory Visit: Payer: Self-pay | Admitting: Internal Medicine

## 2010-04-29 DIAGNOSIS — N926 Irregular menstruation, unspecified: Secondary | ICD-10-CM | POA: Insufficient documentation

## 2010-05-21 ENCOUNTER — Ambulatory Visit: Payer: Self-pay | Admitting: Cardiology

## 2010-05-21 DIAGNOSIS — I4949 Other premature depolarization: Secondary | ICD-10-CM

## 2010-05-21 HISTORY — DX: Other premature depolarization: I49.49

## 2010-05-23 ENCOUNTER — Encounter: Payer: Self-pay | Admitting: Internal Medicine

## 2010-05-23 LAB — CONVERTED CEMR LAB
Free T4: 0.59 ng/dL — ABNORMAL LOW (ref 0.60–1.60)
T3, Free: 2.4 pg/mL (ref 2.3–4.2)
TSH: 3.32 microintl units/mL (ref 0.35–5.50)

## 2010-05-30 ENCOUNTER — Ambulatory Visit: Admit: 2010-05-30 | Payer: Self-pay | Admitting: Endocrinology

## 2010-06-03 ENCOUNTER — Ambulatory Visit: Admission: RE | Admit: 2010-06-03 | Discharge: 2010-06-03 | Payer: Self-pay | Source: Home / Self Care

## 2010-06-03 ENCOUNTER — Encounter: Payer: Self-pay | Admitting: Cardiology

## 2010-06-03 ENCOUNTER — Ambulatory Visit (HOSPITAL_COMMUNITY)
Admission: RE | Admit: 2010-06-03 | Discharge: 2010-06-03 | Payer: Self-pay | Source: Home / Self Care | Attending: Cardiology | Admitting: Cardiology

## 2010-06-10 ENCOUNTER — Ambulatory Visit
Admission: RE | Admit: 2010-06-10 | Discharge: 2010-06-10 | Payer: Self-pay | Source: Home / Self Care | Attending: Internal Medicine | Admitting: Internal Medicine

## 2010-06-10 DIAGNOSIS — K219 Gastro-esophageal reflux disease without esophagitis: Secondary | ICD-10-CM | POA: Insufficient documentation

## 2010-06-23 LAB — CONVERTED CEMR LAB
Metaneph Total, Ur: 240 ug/24hr (ref 182–739)
Metanephrines, Ur: 64 (ref 58–203)
Norepinephrine 24 Hr Urine: 36 mcg/24hr (ref <80)
Normetanephrine, 24H Ur: 176 (ref 88–649)

## 2010-06-25 ENCOUNTER — Telehealth: Payer: Self-pay | Admitting: Cardiology

## 2010-06-27 NOTE — Assessment & Plan Note (Signed)
Summary: fup on thyroid per dr/ccm   Vital Signs:  Patient Profile:   45 Years Old Female Weight:      167 pounds Pulse rate:   66 / minute BP sitting:   110 / 80  (right arm) Cuff size:   regular  Vitals Entered By: Romualdo Bolk, CMA (July 30, 2007 3:17 PM)                 Chief Complaint:  Follow up.  History of Present Illness: Lori Cunningham is here to follow up on thyroid and discuss this with her. Palpitations are gone.   ? why they went away . no new CV symptom .  Dr Lufkin Lions saw her and thought could try b blocker and she felt she didn't want to  try it.   Had tried zoloft and caused wakening.  after 6 months.    off x 3 weeks and  sleep is better.   Found out that mom had thyroid problemalso and had affected her mood .    Still some tired and flat at times   wants to make sure thyroid not affectging her well being.  r ea mildly tender  uses q tips     Prior Medications Reviewed Using: Patient Recall  Prior Medication List:  WELLBUTRIN XL 300 MG  TB24 (BUPROPION HCL) 1 by mouth once daily   Current Allergies (reviewed today): No known allergies   Past Medical History:    Unremarkable    Childbirth   Family History:    mom borderline  dm  cholesterol    No known thyroid disease    Family History of Aneurysm Aorticpaternal GF        add hx mom had thyroid disease during time of pregnany loss.   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   Eyes:     vision grossly intact, pupils equal, pupils round, and pupils reactive to light.  eoms nl Ears:      R ear normal and no external deformities.   tender tragus no redness no waz Neck:     supple, full ROM, no masses, and thyromegaly.  no tenderness or nodules Lungs:     Normal respiratory effort, chest expands symmetrically. Lungs are clear to auscultation, no crackles or wheezes. Heart:     Normal rate and regular rhythm. S1 and S2 normal without gallop, murmur, click, rub or other extra sounds. Extremities:     no cce Skin:     turgor normal and color normal.   Mildy dry skin Cervical Nodes:     No lymphadenopathy noted Psych:     normally interactive and good eye contact.      Impression & Recommendations:  Problem # 1:  GOITER, SIMPLE (ICD-240.0) ? if could have mng and thyroid dysfunction ? if affecting mood.    needs to be followed consider endo appt if needed Orders: Venipuncture ([address]) TLB-TSH (Thyroid Stimulating Hormone) (84443-TSH) TLB-T4 (Thyrox), Free [phone])   Problem #  2:  DYSTHYMIA, SITUATIONAL (ICD-300.4) stable  agree with d/c zoloft  Problem # 3:  ear pain  prob eac discomfort   avoid q tips and rechech prn  Problem # 4:  PALPITATIONS, RECURRENT (ICD-785.1) Assessment: Comment Only  Complete Medication List: 1)  Wellbutrin Xl 300 Mg Tb24 (Bupropion hcl) .Marland Kitchen.. 1 by mouth once daily   Patient Instructions: 1)  tsh and free t4  today  2)  we will call you with results and plan follow up     ]

## 2010-06-27 NOTE — Assessment & Plan Note (Signed)
Summary: Pulmonary/ new pt eval for sob   Visit Type:  Initial Consult Copy to:  Dr. Fabian Sharp Primary Provider/Referring Provider:  [name],MD  CC:  SOB.  History of Present Illness: 45yowf never regular smoker w/u  possible Bronchiectasis by [name]  in late 1989  with course characterized by frequent flares of dyspnea, wheezing without cough and then completely back to baseline in between without the need for any medications for weeks to months  but the duration of remission getting shorter and patient therefore requested pulmonary eval.  February 12, 2009 ov in between flares  now c/o decrease ex tolerance, maybe aggravated by exp to cleaning chemicals at work and assoc with nasal congestion  but again no sign cough, just sob and subjective wheeze. no noct flares. Pt denies any significant sore throat, dyphagia, itching, sneezing,   excess  nasal secretions or cough,  fever, chills, sweats, unintended wt loss, pleuritic or exertional cp, change in activity tolerance  orthopnea pnd or leg swelling. Pt also denies any obvious fluctuation in symptoms with weather or environmental change or other alleviating or aggravating factors other than as above. has tried inhalers and "only make her feel shaky" no benefit to symptom control.     Current Medications (verified): 1)  Levothyroxine Sodium 75 Mcg Tabs (Levothyroxine Sodium) .Marland Kitchen.. 1 By Mouth Once Daily  Allergies (verified): No Known Drug Allergies  Past History:  Past Medical History: DEPRESSION (ICD-311) HYPERTHYROIDISM (ICD-242.90) rs 1 131 2009 GOITER, MULTINODULAR (ICD-241.1) DYSTHYMIA, SITUATIONAL (ICD-300.4) JOINT CREPITUS, KNEE (ICD-719.66) SYMPTOM, HEARTBURN (ICD-787.1) PALPITATIONS, RECURRENT (ICD-785.1) bronchiectasis lll from childhood pneumonia?  has bronch in about 1999. childbirth  Consults  Dr. Pollyann Cunningham  Family History: mom borderline  dm  cholesterol No known thyroid disease Family History  of Aneurysm Aorticpaternal GF add hx mom had thyroid disease during time of pregnany loss.  No FH of Colon Cancer Fam Hx of CTS.  no allergies  Social History: Reviewed history from 01/22/2009 and no changes required. Divorced  recently , works in [employer] 2 daughters Former smoker. Quit in 1985. Smoked 1/2 ppd x 2 yrs. Alcohol Use - yes-socially    HH of 3       Review of Systems       The patient complains of shortness of breath with activity and nasal congestion/difficulty breathing through nose.  The patient denies shortness of breath at rest, productive cough, non-productive cough, coughing up blood, chest pain, irregular heartbeats, acid heartburn, indigestion, loss of appetite, weight change, abdominal pain, difficulty swallowing, sore throat, tooth/dental problems, headaches, sneezing, itching, ear ache, anxiety, depression, hand/feet swelling, joint stiffness or pain, rash, change in color of mucus, and fever.    Vital Signs:  Patient profile:   45 year old female Menstrual status:  regular Weight:      173.38 pounds O2 Sat:      96 % on Room air Temp:     98.6 degrees F oral Pulse rate:   67 / minute BP sitting:   100 / 72  (left arm)  Vitals Entered By: Vernie Murders (February 12, 2009 10:27 AM)  O2 Flow:  Room air  Physical Exam  Additional Exam:  amb pleasant wf nad  wt 173 February 12, 2009  HEENT: nl dentition, turbinates, and orophanx. Nl external ear canals without cough reflex NECK :  without JVD/Nodes/TM/ nl carotid upstrokes bilaterally LUNGS: no acc muscle use, clear to A and P bilaterally without cough on insp or exp maneuvers  CV:  RRR  no s3 or murmur or increase in P2, no edema  ABD:  soft and nontender with nl excursion in the supine position. No bruits or organomegaly, bowel sounds nl MS:  warm without deformities, calf tenderness, cyanosis or clubbing SKIN: warm and dry without lesions   NEURO:  alert, approp, no deficits      CXR  Procedure date:  02/12/2009  Findings:      Comparison: 03/05/2006   Findings: Trachea is midline.  Heart size normal.  Lungs are clear. No pleural fluid.   IMPRESSION: No acute findings.  Impression & Recommendations:  Problem # 1:  DYSPNEA (ICD-786.09)  Certainly sounds like asthma or pseudoasthma/vcd secondary to exposure to various irritants or uri's, though note the spontaneous remissions and failure to respond even transiently to SABA strongly against typical asthma.  Will proceed with cxr and pft's then consider MCT to sort this out.  In meantime GERD precautions reviewed.  Explained natural h/o uri and why it's necessary in patients at risk to rx short term with PPI to reduce risk of evolving cyclical cough triggered by epithelial injury and a heightened sensitivty to the effects of any upper airway irritants,  most importantly acid - related, using the analogy of spilling cleaning liquids on a cut (which seemed to connect the dots for her).    Orders: New Patient Level IV (98119)  Problem # 2:  Hx of BRONCHIECTASIS LEFT LOWER LOBE (ICD-494.0) not present on cxr nor clearly described by ct chest in 1989 when we didn't have high res anyway but no need to repeat at this point in the absence of a clinical syndrom that would require a change in recs.  Other Orders: T-2 View CXR (71020TC)  Patient Instructions: 1)  Schedule f/u office visit with PFT's next available  2)  with next flare try prilosec Take one 30-60 min before first and last meals of the day 3)  GERD (gastroesophageal reflux disease) was discussed. It is a common cause of respiratory symptoms. It commonly presents in the absence of heartburn. GERD can be treated with medication, but also with lifestyle changes including avoidance of late meals, excessive alcohol, smoking cessation, and avoid fatty foods, chocolate, peppermint, colas, red wine, and acidic juices such as orange juice. NO MINT OR MENTHOL  PRODUCTS (no cough drops) 4)  USE SUGARLESS CANDY INSTEAD (jolley ranchers)  5)

## 2010-06-27 NOTE — Procedures (Signed)
Summary: Colonoscopy   Colonoscopy  Procedure date:  02/21/2008  Findings:      Location:  [facility].    Procedures Next Due Date:    Colonoscopy: 02/2018  Patient Name: Lori, Cunningham MRN:  Procedure Procedures: Colonoscopy CPT: 47829.  Personnel: Endoscopist: Iliani Vejar L. Juanda Chance, MD.  Exam Location: Exam performed in Outpatient Clinic. Outpatient  Patient Consent: Procedure, Alternatives, Risks and Benefits discussed, consent obtained, from patient. Consent was obtained by the RN.  Indications Symptoms: Hematochezia.  Comments: hx of anal fissure History  Current Medications: Patient is not currently taking Coumadin.  Pre-Exam Physical: Performed Feb 21, 2008. Entire physical exam was normal.  Comments: Pt. history reviewed/updated, physical exam performed prior to initiation of sedation?yes Exam Exam: Extent of exam reached: Cecum, extent intended: Cecum.  The cecum was identified by appendiceal orifice and IC valve. Duration of exam: time in 7:04 min, out 8:18 min minutes. Colon retroflexion performed. Images taken. ASA Classification: I. Tolerance: good.  Monitoring: Pulse and BP monitoring, Oximetry used. Supplemental O2 given.  Colon Prep Used Miralax for colon prep. Prep results: good.  Sedation Meds: Patient assessed and found to be appropriate for moderate (conscious) sedation. Fentanyl 50 mcg. given IV. Versed 3 mg. given IV.  Findings - NORMAL EXAM: Cecum.  - FISSURE / FISTULA: Fissure in Rectum. ICD9: Anal Fissure: 565.   Assessment Abnormal examination, see findings above.  Diagnoses: 565: Anal Fissure.   Comments: Healed anal fissure, no active fissure, the bleeding if likely due to intermettent activation of the fissure whicj may , eventually, be surgically repaired Events  Unplanned Interventions: No intervention was required.  Unplanned Events: There were no complications. Plans Medication Plan:  Anti-constipation: Stool Surfactant Agents 1 prn, starting Feb 21, 2008  Analpram cream 2.5 %: prn, starting Feb 21, 2008   Patient Education: Patient given standard instructions for: Patient instructed to get routine colonoscopy every 10 years.  Disposition: After procedure patient sent to recovery. After recovery patient sent home.    cc: Berniece Andreas, MD  This report was created from the original endoscopy report, which was reviewed and signed by the above listed endoscopist.

## 2010-06-27 NOTE — Letter (Signed)
Summary: Patient [facility] Biopsy Results  [facility]  [address] [place], [place] [address]   Phone: [phone]  Fax: [phone]        February 22, 2008 MRN: [id number]    Lori Cunningham [address] [place], [place]  [address]    Dear Ms. Elsbernd,  I am pleased to inform you that the biopsies taken during your recent endoscopic examination did not show any evidence of cancer upon pathologic examination.There is mild inflammation due to reflux  Additional information/recommendations:  _  _x_ Continue with the treatment plan as outlined on the day of your      exam.  _.   Please call us if you are having persistent problems or have questions about your condition that have not been fully answered at this time.  Sincerely,  Hart Carwin MD  This letter has been electronically signed by your physician.

## 2010-06-27 NOTE — Progress Notes (Signed)
Summary: culture results  Phone Note Call from Patient Call back at [phone]   Caller: vm Call For: Trust Crago Summary of Call: Urine culture [place] Thurs.  Results.  Finished antibiotics.  What to do?   Initial call taken by: Rudy Jew, RN,  March 06, 2008 11:19 AM  Follow-up for Phone Call        Pt aware of results. Follow-up by: Romualdo Bolk, CMA,  March 06, 2008 2:45 PM

## 2010-06-27 NOTE — Assessment & Plan Note (Signed)
Summary: consult re: menstruation issues/cjr   Vital Signs:  Patient profile:   45 year old female Menstrual status:  regular LMP:     04/15/2010 Weight:      170 pounds Pulse rate:   66 / minute BP sitting:   120 / 80  (left arm) Cuff size:   regular  Vitals Entered By: Romualdo Bolk, CMA (AAMA) (April 29, 2010 3:16 PM) CC: Discuss periods. Pt states that she is going 40 days in between her periods. 2) Discuss her sleep study. 3) Coughing that started on 12/2. 4) Pt wants to have labs done at [facility] for Dr. Lucianne Muss. LMP (date): 04/15/2010 LMP - Character: normal Menarche (age onset years): 9   Menses interval (days): 32 Menstrual flow (days): 5 Enter LMP: 04/15/2010 Last PAP Result NEGATIVE FOR INTRAEPITHELIAL LESIONS OR MALIGNANCY.   History of Present Illness: Lori Cunningham  comes in today  for above problem.  wants to discuss above.  Periods as above .  ? if this is normal or could be  a hormonal problem . palpitations have some recurrence     at times   and had pvcs in sleep.   numerous   per Pulmonary   but no apnea.   Told to see cards again. Recently has been hoarse  and cough not sick  but no cp sob ...   no fever .  Preventive Screening-Counseling & Management  Alcohol-Tobacco     Alcohol drinks/day: <1     Smoking Status: quit     Year Quit: age 32  Caffeine-Diet-Exercise     Caffeine use/day: 2     Does Patient Exercise: no  Current Medications (verified): 1)  Armour Thyroid 60 Mg Tabs (Thyroid) .... Take One By Mouth Once Daily 2)  Docusate Sodium 100 Mg Caps (Docusate Sodium) .... Take Three By Mouth At Bedtime 3)  Prilosec 20 Mg Cpdr (Omeprazole) .... Take  One 30-60 Min Before First Meal of The Day 4)  Melatonin 5)  5h Herbal Supplement 6)  B Complex  Tabs (B Complex Vitamins) 7)  Triamcinolone Acetonide 0.1 % Crea (Triamcinolone Acetonide) .... Apply To Eczema  Two Times A Day 8)  Cyclobenzaprine Hcl 10 Mg Tabs (Cyclobenzaprine Hcl) .Marland Kitchen.. 1 By  Mouth Three Times A Day As Needed Msucle Spasm 9)  Zoloft 50 Mg Tabs (Sertraline Hcl) .Marland Kitchen.. 1 By Mouth Once Daily Per [facility] 10)  Trazodone Hcl 50 Mg  Tabs (Trazodone Hcl) .... At Bedtime  Allergies (verified): No Known Drug Allergies  Past History:  Past medical, surgical, family and social histories (including risk factors) reviewed, and no changes noted (except as noted below).  Past Medical History: DEPRESSION (ICD-311) HYPERTHYROIDISM (ICD-242.90) rs 1 131 2009 GOITER, MULTINODULAR (ICD-241.1) DYSTHYMIA, SITUATIONAL (ICD-300.4) JOINT CREPITUS, KNEE (ICD-719.66) SYMPTOM, HEARTBURN (ICD-787.1) PALPITATIONS, RECURRENT (ICD-785.1) bronchiectasis lll from childhood pneumonia?  had  bronch in about 1999     - PFT's wnl 04/12/09 childbirth Sleep study  Consults  Dr. [name]  Past Surgical History: Reviewed history from 03/13/2009 and no changes required. Carpal tunnel release bilateral    Sypher  Caesarean section  94 and 2000 Turbinate reduction deviated septum  shoemaker.  Bronchoscopy  in 1980s  bronchiesctesis  lll   Dr hopper Lucien Mons  Past History:  Care Management: Endocrinology: Dr. Everardo All- In the past; Lucianne Muss Couselor: Tomasa Rand- In the past Pulmonary : [facility]-in the past-[facility]  Family History: Reviewed history from 07/16/2009 and no changes required. mom borderline  dm  cholesterol No known thyroid disease Family History of Aneurysm Aorticpaternal GF add hx mom had thyroid disease during time of pregnany loss.  No FH of Colon Cancer Fam Hx of CTS. no allergies Father sleepwalking   Social History: Reviewed history from 02/04/2010 and no changes required. Divorced   , works in [employer].  is currently a Consulting civil engineer. 2 daughters Former smoker. Quit in 1985. Smoked 1/2 ppd x 2 yrs. Alcohol Use - yes-socially    HH of 3   Illicit Drug Use - no Patient does not get regular exercise.  Daily Caffeine Use  Review of Systems  The  patient denies anorexia, fever, weight loss, weight gain, vision loss, syncope, dyspnea on exertion, peripheral edema, muscle weakness, difficulty walking, abnormal bleeding, enlarged lymph nodes, and angioedema.    Physical Exam  General:  Well-developed,well-nourished,in no acute distress; alert,appropriate and cooperative throughout examination Head:  normocephalic and atraumatic.   Eyes:  vision grossly intact.   Ears:  R ear normal and L ear normal.   Nose:  no external deformity, no external erythema, and no nasal discharge.   Mouth:  pharynx pink and moist.   Neck:  supple and full ROM.  palpable thyroid nontender Lungs:  Normal respiratory effort, chest expands symmetrically. Lungs are clear to auscultation, no crackles or wheezes.no dullness.   Heart:  Normal rate and regular rhythm. S1 and S2 normal without gallop, murmur, click, rub or other extra sounds. Abdomen:  no cva pain.  Msk:  no joint warmth and no redness over joints.   Pulses:  nl cap refill  Neurologic:  non focal  Skin:  turgor normal, color normal, no ecchymoses, and no petechiae.   Cervical Nodes:  No lymphadenopathy noted Psych:  Oriented X3, normally interactive, good eye contact, not anxious appearing, and not depressed appearing.     Impression & Recommendations:  Problem # 1:  PALPITATIONS, RECURRENT (ICD-785.1) apparent frequent pvcs   found on sleep study ...    ? cause .   feels similar to when she had thyroid  dysfunctional  but tsh have been ok . INteresting that  began to be a problem about the same time taking supplements.  so rec stop  in the meantime.   Problem # 2:  HYPOTHYROIDISM, POST-RADIATION (ICD-244.1) disc aboaut meds and follow up .  will follow up with Dr Lucianne Muss  Her updated medication list for this problem includes:    Armour Thyroid 60 Mg Tabs (Thyroid) .Marland Kitchen... Take one by mouth once daily  Problem # 3:  IRREGULAR MENSES (ICD-626.4) Assessment: New prob  early perimenopausal    Problem # 4:  COUGH (ICD-786.2) Assessment: New pro viral     observe and recheck as needed   Complete Medication List: 1)  Armour Thyroid 60 Mg Tabs (Thyroid) .... Take one by mouth once daily 2)  Docusate Sodium 100 Mg Caps (Docusate sodium) .... Take three by mouth at bedtime 3)  Prilosec 20 Mg Cpdr (Omeprazole) .... Take  one 30-60 min before first meal of the day 4)  Melatonin  5)  5h Herbal Supplement  6)  B Complex Tabs (B complex vitamins) 7)  Triamcinolone Acetonide 0.1 % Crea (Triamcinolone acetonide) .... Apply to eczema  two times a day 8)  Cyclobenzaprine Hcl 10 Mg Tabs (Cyclobenzaprine hcl) .Marland Kitchen.. 1 by mouth three times a day as needed msucle spasm 9)  Zoloft 50 Mg Tabs (Sertraline hcl) .Marland Kitchen.. 1 by mouth once daily per [facility] 10)  Trazodone Hcl  50 Mg Tabs (Trazodone hcl) .... At bedtime  Patient Instructions: 1)  TSH  t 3 and free T4  at [facility] office    send results to   Dr Lucianne Muss  2)  Dx  hypothyroidism sp I 131 3)  Also told to stop supplement in the meantime  in  case poss interactions   Orders Added: 1)  Est. Patient Level III [40981]
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
## 2010-06-27 NOTE — Assessment & Plan Note (Signed)
Summary: fu-per pt med adjustment--$50---stc   Vital Signs:  Patient Profile:   45 Years Old Female Height:     63.25 inches Weight:      161.2 pounds Temp:     98.1 degrees F oral Pulse rate:   72 / minute BP sitting:   92 / 58  (left arm) Cuff size:   regular  Pt. in pain?   no  Vitals Entered By: Orlan Leavens (February 08, 2008 4:53 PM)                  PCP:  Malena Edman  Chief Complaint:  med adjustment.  History of Present Illness: now 4 1/2 mos s/p i-131 rx.  takes synthroid 2 x 50 micrograms/d.    Current Allergies: No known allergies   Past Medical History:    Reviewed history from 01/26/2008 and no changes required:       HEMORRHAGE OF RECTUM AND ANUS (ICD-569.3)       ROUTINE GYNECOLOGICAL EXAMINATION (ICD-V72.31)       DEPRESSION (ICD-311)       HYPERTHYROIDISM (ICD-242.90) rs 1 131 2009       GOITER, MULTINODULAR (ICD-241.1)       DYSTHYMIA, SITUATIONAL (ICD-300.4)       JOINT CREPITUS, KNEE (ICD-719.66)       SYMPTOM, HEARTBURN (ICD-787.1)       PALPITATIONS, RECURRENT (ICD-785.1)       FAMILY HISTORY OF ANEURYSM AORTIC (ICD-V17.4)             Review of Systems  The patient denies weight loss and weight gain.     Physical Exam  General:     well developed, well nourished, in no acute distress Neck:     5 x normal size thyroid, right > left Additional Exam:     FastTSH                   2.34 uIU/mL     Impression & Recommendations:  Problem # 1:  GOITER, MULTINODULAR (ICD-241.1) Assessment: Unchanged  Problem # 2:  HYPOTHYROIDISM, POST-RADIATION (ICD-244.1) well-replaced   Patient Instructions: 1)  same synthroid (100/d) 2)  ret 6 weeks   Prescriptions: SYNTHROID 100 MCG TABS (LEVOTHYROXINE SODIUM) qd  #1 x 1   Entered and Authorized by:   Minus Breeding MD   Signed by:   Minus Breeding MD on 02/09/2008   Method used:   Electronically to        [facility]* (retail)       [address] [place], [place]  [address]       Ph: [phone]       Fax: [phone]   RxID:   [id number]  ]

## 2010-06-27 NOTE — Assessment & Plan Note (Signed)
Summary: FU ON THYROID/ $ /NWS   Vital Signs:  Patient Profile:   45 Years Old Female Height:     63.25 inches Weight:      166.0 pounds Temp:     97.1 degrees F oral Pulse rate:   77 / minute BP sitting:   118 / 72  (left arm) Cuff size:   regular  Pt. in pain?   no  Vitals Entered By: Orlan Leavens (January 20, 2008 7:59 AM)                  PCP:  Malena Edman  Chief Complaint:  FOLLOW-UP ON THYROID.  History of Present Illness: pt says, since she started synthroid, she developed generalized weakness, increased menstruation, myalgias, headache, and nausea.      Current Allergies: No known allergies   Past Medical History:    Reviewed history from 12/03/2007 and no changes required:       HEMORRHAGE OF RECTUM AND ANUS (ICD-569.3)       ROUTINE GYNECOLOGICAL EXAMINATION (ICD-V72.31)       DEPRESSION (ICD-311)       HYPERTHYROIDISM (ICD-242.90)       GOITER, MULTINODULAR (ICD-241.1)       DYSTHYMIA, SITUATIONAL (ICD-300.4)       JOINT CREPITUS, KNEE (ICD-719.66)       SYMPTOM, HEARTBURN (ICD-787.1)       PALPITATIONS, RECURRENT (ICD-785.1)       FAMILY HISTORY OF ANEURYSM AORTIC (ICD-V17.4)            Review of Systems       has lost few lbs, due to her efforts   Physical Exam  General:     normal appearance.   Neck:     multinodular goiter, approx 4x normal size, right > left Msk:     no muscle tenderness Psych:     alert and cooperative; normal mood and affect; normal attention span and concentration Additional Exam:     FastTSH              [H]  10.46 uIU/mL      Impression & Recommendations:  Problem # 1:  HYPOTHYROIDISM, POST-RADIATION (ICD-244.1) rx limited by perceived intolerance to synthroid   Patient Instructions: 1)  please make every effort to take the synthroid 50/d 2)  ret 30d   ]

## 2010-06-27 NOTE — Miscellaneous (Signed)
Summary: Discharge Summary for PT [facility]  Discharge Summary for PT Services/[facility]   Imported By: Maryln Gottron 11/01/2008 15:50:47  _____________________________________________________________________  External Attachment:    Type:   Image     Comment:   External Document

## 2010-06-27 NOTE — Progress Notes (Signed)
  Phone Note Call from Patient Call back at Home Phone [phone]   Caller: Patient/[phone] Call For: dr ellsion Summary of Call: per pt call want to talk with dr ellsion about having alot  of concerns about additional  thyroid test that dr ellsion has order for her want to speak with him about it  Initial call taken by: Shelbie Proctor,  August 26, 2007 9:58 AM  Follow-up for Phone Call        i called pt 4/2/9.  we'll do i-131 rx after [place], unless cold nodule Follow-up by: Minus Breeding MD,  August 26, 2007 12:46 PM

## 2010-06-27 NOTE — Assessment & Plan Note (Signed)
Summary: RECTAL BLEED/YF   History of Present Illness Visit Type: consult Primary GI MD: [name] MD Primary Provider: Malena Edman Requesting Provider: Malena Edman Chief Complaint: BRB due to anal fissure x 8 yrs.Pt has a BM every day but pt has hard BM's. Pt takes 3 stool softeners every night. Pt also has solid food dysphagia sometimes with meats. History of Present Illness:   45 year old white female with two  she issues, one is  intermittent rectal bleeding for about  5 years which she  attributs  to an  anal fissure. She has  rectal pain and rectal bleeding intermittently on  tissue as well as in the commode. She is constipated but she takes Colace 3 tablets a day which resulted in a normal bowel movement. Her mother just had surgery for anal fissure. Patient is quite concerned about the rectal bleeding and would like to have it evaluated. She has used suppositories and creams  on intermittent basis. There is no family history of colon cancer.  Another problem has been on choking sensation in his throat and symptoms of reflux. She has been treated by Dr. Everardo All for goiter and had radioactive iodine treatment in May 2009. The dysphagia occurs all intermittently but she denies any odynophagia or picture regurgitation of the food or food impaction. She generally doesn't have any trouble swallowing solids or liquids she is taking him Maalox and TUMS as needed for a reflux symptoms her father has a history of esophageal stricture which has been dilated periodically   GI Review of Systems    Reports acid reflux and  dysphagia with solids.       Reports anal fissure and rectal bleeding.        Prior Medications Reviewed Using: Patient Recall  Updated Prior Medication List: WELLBUTRIN XL 150 MG  TB24 (BUPROPION HCL) TAKE 1 three times a day QD  Current Allergies (reviewed today): No known allergies   Past Medical History:    Reviewed history from 12/02/2007 and no changes  required:       HEMORRHAGE OF RECTUM AND ANUS (ICD-569.3)       ROUTINE GYNECOLOGICAL EXAMINATION (ICD-V72.31)       DEPRESSION (ICD-311)       HYPERTHYROIDISM (ICD-242.90)       GOITER, MULTINODULAR (ICD-241.1)       DYSTHYMIA, SITUATIONAL (ICD-300.4)       JOINT CREPITUS, KNEE (ICD-719.66)       SYMPTOM, HEARTBURN (ICD-787.1)       PALPITATIONS, RECURRENT (ICD-785.1)       FAMILY HISTORY OF ANEURYSM AORTIC (ICD-V17.4)         Past Surgical History:    Reviewed history from 12/02/2007 and no changes required:       Carpal tunnel release right          Caesarean section  94 and 2000       Turbinate reduction deviated septum  shoemaker.    Family History:    Reviewed history from 12/02/2007 and no changes required:       mom borderline  dm  cholesterol       No known thyroid disease       Family History of Aneurysm Aorticpaternal GF       add hx mom had thyroid disease during time of pregnany loss.        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         Pertinent positive and negative review of systems were noted in the above HPI. All other ROS was otherwise negative.    Vital Signs:  Patient Profile:   45 Years Old Female Height:     63.25 inches Weight:      169 pounds BMI:     29.81 BSA:     1.81 Pulse rate:   80 / minute Pulse rhythm:   regular BP sitting:   986 / 0  (right arm)  Vitals Entered By: Christie Nottingham CMA (December 03, 2007 3:22 PM)                  Physical Exam  General:     Well developed, well nourished, no acute distress. Mouth:     no evidence of thrush Neck:     goiter present it is nontender and bilateral symmetrical Lungs:      Clear throughout to auscultation. Heart:     Regular rate and rhythm; no murmurs, rubs,  or bruits. Abdomen:     abdomen is soft nontender with normal active bowel sounds minimal discomfort in the left lower quadrant no  rebound, liver edge is  at costal margin Rectal:     rectal and anoscopic exam reveals an external hemorrhoidal tags. At three  o'clock superficial anal fissure with large amount of scar tissue heaped up surrounding the fissure. Rectal tone is normal, there is no spasm, stool in the rectal ampulla is Hemoccult negative. 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 I could not demonstrate any active bleeding today and for that reason I would suggest proceeding with colonoscopy to rule out additional lesions such as large polyps. I have suggested to treat fissure medically first hoping that he may  avoid  surgery. I have emphasized importance of stool softeners and gave her a booklet on treatment of  anal fissure ; she will use Anusol-HC suppositories combined with Analpram 2.5%  3 times a day  Problem # 2:  SYMPTOM, HEARTBURN (ICD-787.1) gastroesophageal reflux  symptoms as well as choking sensations in  her throat may be related to reflux but also could be due to  goiter or possibly due to  globus sensation .There is a family history of gastro esophageal  reflux and  esophagus stricture in her father who undergoes dilations;  so she would like to have it evaluated . We  will proceed with upper endoscopic exam. In the meantime I gave her samples of Protonix 40 mg a day to take a daily basis Orders: Colon/Endo (Colon/Endo)    Patient Instructions: 1)  colonoscopy scheduled 2)  Booklet of anal fissure 3)  Analpram cream 2.5%  t.i.d. 4)  Anusol-HC suppositories one q.h.s. 5)  Copy Sent To:Dr W.Panosh, Dr S.Ellison 6)  [facility] Patient Information Guide given to patient.    Prescriptions: REGLAN 10 MG  TABS (METOCLOPRAMIDE HCL) As per prep instructions.  #2 x 0   Entered by:   Hortense Ramal CMA  Authorized by:   Hart Carwin MD   Signed by:   Hortense Ramal CMA on 12/03/2007   Method used:   Electronically sent to ...       [facility]*       [address] [place], [place]  [address]       Ph: [phone]       Fax: [phone]   RxID:   [id number] MIRALAX   POWD (POLYETHYLENE GLYCOL 3350) As per prep  instructions.  #255gm x 0   Entered by:   Hortense Ramal CMA   Authorized by:   Hart Carwin MD   Signed by:   Hortense Ramal CMA on 12/03/2007   Method used:   Electronically sent to ...       [facility]*       [address] [place], [place]  [address]       Ph: [phone]       Fax: [phone]   RxID:   [id number] DULCOLAX 5 MG  TBEC (BISACODYL) Day before procedure take 2 at 3pm and 2 at 8pm.  #4 x 0   Entered by:   Hortense Ramal CMA   Authorized by:   Hart Carwin MD   Signed by:   Hortense Ramal CMA on 12/03/2007   Method used:   Electronically sent to ...       [facility]*       [address] [place], [place]  [address]       Ph: [phone]       Fax: [phone]   RxID:   [id number] [facility] SINGLES 1-2.5 %  CREA (HYDROCORTISONE ACE-PRAMOXINE) Apply to rectum three times a day  #1 kit x 1   Entered by:   Hortense Ramal CMA   Authorized by:   Hart Carwin MD   Signed by:   Hortense Ramal CMA on 12/03/2007   Method used:   Electronically sent to ...       [facility]*       [address] [place], [place]  [address]       Ph: [phone]       Fax: [phone]   RxID:   [id number] ANUSOL-HC 25 MG  SUPP (HYDROCORTISONE ACETATE) Insert 1 suppository into rectum every night  #12 x 1   Entered by:   Hortense Ramal CMA   Authorized by:   Hart Carwin MD   Signed by:   Hortense Ramal CMA on 12/03/2007   Method used:   Electronically sent to ...       [facility]*       [address] [place], [place]  [address]       Ph: [phone]       Fax: [phone]   RxID:   [id number]  ]
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
## 2010-06-27 NOTE — Progress Notes (Signed)
Summary: PT RET YOUR CALL   Phone Note Call from Patient   Caller: PT LIVE Call For: [facility]  Summary of Call: [phone] IS WHERE THE PT CAN BE REACHED SHE RETURNED YOUR CALL  Initial call taken by: Roselle Locus,  February 02, 2008 10:13 AM  Follow-up for Phone Call        left message on pt voicemail Follow-up by: Romualdo Bolk, CMA,  February 03, 2008 10:17 AM  Additional Follow-up for Phone Call Additional follow up Details #1::        Pt coming in today. Additional Follow-up by: Romualdo Bolk, CMA,  February 11, 2008 8:09 AM

## 2010-06-27 NOTE — Assessment & Plan Note (Signed)
Summary: discuss several issues-pt fasting/ssc   Vital Signs:  Patient Profile:   45 Years Old Female Weight:      167 pounds Pulse rate:   60 / minute BP sitting:   120 / 80  (left arm) Cuff size:   regular  Vitals Entered By: Romualdo Bolk, CMA (March 22, 2007 8:52 AM)                 Chief Complaint:  Discuss bilateral knee pain x 1 year, ?gerd or heart palps, doing colonscopy or endo ? hernia or gerd, and left hand carpal tunnel, and any wellness info. Pt fasting for labs.  History of Present Illness: Lori Cunningham  sees Dr. Marcelle Overlie for gyne but no  other preventive health problems. Has seen Dr. Scotty Court in the pst  Palpitations are discribed  like when ears get stopped up and can feels like like pounding sometimes rapid. starts and stopps like a switchs .  no family .hx of sames.   Minimizes caffiene nor etoh related. Lasts about 10 minutes and no other symptom .   On Wellbutrin soince february.    Stress much better than  Knee crunches when going up stairs  one spot on left knee  otherwise no sig pain  Current Allergies (reviewed today): No known allergies   Past Surgical History:    Denies surgical history    Carpal tunnel release right       Caesarean section  94 and 2000        Turbinate reduction deviated septum  shoemakers.    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   Head:     normocephalic and atraumatic.   Eyes:     vision grossly intact, pupils equal, pupils round, and pupils reactive to light.   Ears:     R ear normal and L ear normal.   Nose:     no nasal discharge.   Mouth:     pharynx pink and moist.   Neck:     palpable goiter  without nodule or tenderness Lungs:     Normal respiratory effort, chest expands symmetrically. Lungs are clear to auscultation, no crackles or wheezes. Heart:     Normal rate and regular rhythm. S1 and S2 normal without gallop, murmur, click, rub or other extra sounds. Abdomen:     Bowel sounds positive,abdomen soft and non-tender without masses, organomegaly or hernias noted. Msk:     no joint deformities, no joint instability, and crepitation  both knees on flexion , tender spot inferomedial left knee. neg drawer no effusion.   Pulses:     intact Extremities:     tender cyst vs nodule along r hand 4th palmar tendon. Neurologic:     grossly normal  Skin:     turgor normal and color normal.   Cervical Nodes:     No lymphadenopathy noted Psych:     memory intact for recent and remote, normally interactive, and good eye contact.   Additional Exam:     s inus rhythym   pvc short pr.    Impression &  Recommendations:  Problem # 1:  PALPITATIONS, RECURRENT (ICD-785.1) sound like tachy spells and with pvc and short pr n ekg rec cardiology consult about need for further testing.    lot [id number], EXP 30 jun 09, sanofi pasteur left deltoid IM, 0.5 cc. ..................................................................Marland KitchenRomualdo Bolk, CMA  March 22, 2007 9:13 AM  Orders: Venipuncture [phone]) TLB-Lipid Panel (80061-LIPID) TLB-BMP (Basic Metabolic Panel-BMET) (80048-METABOL) TLB-CBC Platelet - w/Differential (85025-CBCD) TLB-Hepatic/Liver Function Pnl (80076-HEPATIC) TLB-TSH (Thyroid Stimulating Hormone) (84443-TSH) T-Vitamin D (25-Hydroxy) (60454-09811) T- * Misc. Laboratory test [phone]) EKG w/ Interpretation (93000) Cardiology Referral (Cardiology)   Problem # 2:  GOITER, SIMPLE (ICD-240.0) no symptom but r/o thyroididtis Orders: Venipuncture (29562) TLB-Lipid Panel (80061-LIPID) TLB-BMP (Basic Metabolic Panel-BMET) (80048-METABOL) TLB-CBC Platelet - w/Differential  (85025-CBCD) TLB-Hepatic/Liver Function Pnl (80076-HEPATIC) TLB-TSH (Thyroid Stimulating Hormone) (84443-TSH) T-Vitamin D (25-Hydroxy) (13086-57846) T- * Misc. Laboratory test [phone])   Problem # 3:  SYMPTOM, HEARTBURN (ICD-787.1) weight loss small meals as needed symptom rx recheck if increasing symptom  Orders: Venipuncture (28413) TLB-Lipid Panel (80061-LIPID) TLB-BMP (Basic Metabolic Panel-BMET) (80048-METABOL) TLB-CBC Platelet - w/Differential (85025-CBCD) TLB-Hepatic/Liver Function Pnl (80076-HEPATIC) TLB-TSH (Thyroid Stimulating Hormone) (84443-TSH) T-Vitamin D (25-Hydroxy) (24401-02725) T- * Misc. Laboratory test [phone])   Problem # 4:  JOINT CREPITUS, KNEE (ICD-719.66) tracking issues  and disc  exercise and strenthening  to avoid progression to knee pain etc.   Complete Medication List: 1)  Wellbutrin Xl 300 Mg Tb24 (Bupropion hcl) .Marland Kitchen.. 1 by mouth once daily  Other Orders: Tdap => 34yrs IM (03474) Admin 1st Vaccine (25956) Influenza Vaccine NON MCR (38756)   Patient Instructions: 1)  exercise ho on knees and high potassium foods    ]  Tetanus/Td Vaccine    Vaccine Type: Tdap    Site: right deltoid    Mfr: Sanofi Pasteur    Dose: 0.5 ml    Route: IM    Given by: Romualdo Bolk, CMA    Exp. Date: 03/12/2009    Lot #: [id number]    VIS given: 12/04/04 version given March 22, 2007.  Influenza Vaccine    Vaccine Type: Fluvax Non-MCR  Flu Vaccine Consent Questions    Do you have a history of severe allergic reactions to this vaccine? no    Any prior history of allergic reactions to egg and/or gelatin? no    Do you have a sensitivity to the preservative Thimersol? no    Do you have a past history of Guillan-Barre Syndrome? no    Do you currently have an acute febrile illness? no    Have you ever had a severe reaction to latex? no    Vaccine information given and explained to patient? yes    Are you currently pregnant? no

## 2010-06-27 NOTE — Assessment & Plan Note (Signed)
Summary: Pulmonary/  final f/u with nl PFT's   Copy to:  Dr. Fabian Sharp Primary Provider/Referring Provider:  Berniece Andreas, MD  CC:  Followup with PFT's.  Pt states that her breathing is the same- no better or worse.  She complains that albuterol makes her nervous.Marland Kitchen  History of Present Illness: 43yowf never regular smoker w/u  possible Bronchiectasis by [name]  in late 1989  with course characterized by frequent flares of dyspnea, wheezing without cough and then completely back to baseline in between without the need for any medications for weeks to months  but the duration of remission getting shorter and patient therefore requested pulmonary eval.  February 12, 2009 ov in between flares  now c/o decrease ex tolerance, maybe aggravated by exp to cleaning chemicals at work and assoc with nasal congestion  but again no sign cough, just sob and subjective wheeze. no noct flares.. has tried inhalers and "only make her feel shaky" no benefit to symptom control.    April 12, 2009 Followup with PFT's.  Pt states that her breathing is the same- no better or worse.  She complains that albuterol makes her nervous more than helps breathing. Pt denies any significant sore throat, dysphagia, itching, sneezing,  nasal congestion or excess secretions,  fever, chills, sweats, unintended wt loss, pleuritic or exertional cp, hempoptysis, change in activity tolerance  orthopnea pnd or leg swelling. No h/o hemoptyssis. Pt also denies any obvious fluctuation in symptoms with weather or environmental change or other alleviating or aggravating factors.       Current Medications (verified): 1)  Armour Thyroid 60 Mg Tabs (Thyroid) .... Take One By Mouth Once Daily 2)  Docusate Sodium 100 Mg Caps (Docusate Sodium) .... Take Three By Mouth At Bedtime 3)  Prilosec 20 Mg Cpdr (Omeprazole) .... Take 1 Tablet By Mouth Once A Day  Allergies (verified): No Known Drug Allergies  Past History:  Past Medical History:  DEPRESSION (ICD-311) HYPERTHYROIDISM (ICD-242.90) rs 1 131 2009 GOITER, MULTINODULAR (ICD-241.1) DYSTHYMIA, SITUATIONAL (ICD-300.4) JOINT CREPITUS, KNEE (ICD-719.66) SYMPTOM, HEARTBURN (ICD-787.1) PALPITATIONS, RECURRENT (ICD-785.1) bronchiectasis lll from childhood pneumonia?  had  bronch in about 1999     - PFT's wnl 04/12/09 childbirth  Consults  Dr. Virgel Bouquet Shadell Brenn  Vital Signs:  Patient profile:   45 year old female Menstrual status:  regular Height:      63 inches Weight:      166 pounds O2 Sat:      98 % on Room air Temp:     97.7 degrees F oral Pulse rate:   77 / minute BP sitting:   130 / 74  (left arm)  Vitals Entered By: Vernie Murders (April 12, 2009 4:24 PM)  O2 Flow:  Room air  Physical Exam  Additional Exam:  amb pleasant wf nad  wt 173 February 12, 2009 > 166 04/12/09 HEENT: nl dentition, turbinates, and orophanx. Nl external ear canals without cough reflex NECK :  without JVD/Nodes/TM/ nl carotid upstrokes bilaterally LUNGS: no acc muscle use, clear to A and P bilaterally without cough on insp or exp maneuvers CV:  RRR  no s3 or murmur or increase in P2, no edema  ABD:  soft and nontender with nl excursion in the supine position. No bruits or organomegaly, bowel sounds nl MS:  warm without deformities, calf tenderness, cyanosis or clubbing      Impression & Recommendations:  Problem # 1:  Hx of BRONCHIECTASIS LEFT LOWER LOBE (ICD-494.0) No significant structural or functional  implications of the dx of bronchiectasis - most likely we could still find it on ct scan but not worth the cost and radiation pursuing "bronchial scarring" since no syndrome to treat at this point.  she is prone to pneumonia and needs to keep up with pneumovax and yearly flu vaccination and if develops persistent cough or purulent sputum would have a low threshold for abx in this setting.  pulmonary f/u can be prn  Medications Added to Medication List This Visit:  1)  Prilosec 20 Mg Cpdr (Omeprazole) .... Take  one 30-60 min before first meal of the day  Other Orders: Est. Patient Level III (52841)  Patient Instructions: 1)  Please schedule a follow-up appointment as needed.

## 2010-06-27 NOTE — Assessment & Plan Note (Signed)
Summary: PER 6 WK FU-REQ'D A MON--$50---STC   Vital Signs:  Patient Profile:   45 Years Old Female Height:     63.25 inches Weight:      160.6 pounds O2 Sat:      95 % O2 treatment:    Room Air Temp:     98.5 degrees F oral Pulse rate:   82 / minute BP sitting:   98 / 54  (left arm) Cuff size:   regular  Pt. in pain?   no  Vitals Entered By: Orlan Leavens (March 27, 2008 3:59 PM)                  PCP:  Malena Edman  Chief Complaint:  6 week follow-up.  History of Present Illness: pt now 6 mos s/p i-131 rx for hyperthyroidism.  she feels no different, and well in general, on synthroid 100 micrograms once daily.    Prior Medications Reviewed Using: Patient Recall  Updated Prior Medication List: WELLBUTRIN XL 150 MG  TB24 (BUPROPION HCL) TAKE 1 two times a day SYNTHROID 100 MCG TABS (LEVOTHYROXINE SODIUM) take 1 by mouth qd  Current Allergies: No known allergies   Past Medical History:    Reviewed history from 02/11/2008 and no changes required:       HEMORRHAGE OF RECTUM AND ANUS (ICD-569.3)       DEPRESSION (ICD-311)       HYPERTHYROIDISM (ICD-242.90) rs 1 131 2009       GOITER, MULTINODULAR (ICD-241.1)       DYSTHYMIA, SITUATIONAL (ICD-300.4)       JOINT CREPITUS, KNEE (ICD-719.66)       SYMPTOM, HEARTBURN (ICD-787.1)       PALPITATIONS, RECURRENT (ICD-785.1)       FAMILY HISTORY OF ANEURYSM AORTIC (ICD-V17.4)        bronchiectasis lll from childhood pneumonia?  has bronch in about 1999.       childbirth            Review of Systems  The patient denies weight loss and weight gain.     Physical Exam  General:     well developed, well nourished, in no acute distress Neck:     thyroid slightly and diffusely enlarged Skin:     not diaphoretic Psych:     alert and cooperative; normal mood and affect; normal attention span and concentration Additional Exam:     FastTSH                   1.14 uIU/mL             Impression & Recommendations:   Problem # 1:  HYPOTHYROIDISM, POST-RADIATION (ICD-244.1) well-replaced  Medications Added to Medication List This Visit: 1)  Wellbutrin Xl 150 Mg Tb24 (Bupropion hcl) .... Take 1 two times a day   Patient Instructions: 1)  same synthroid (100/d) 2)  ret 4 mos   ]

## 2010-06-27 NOTE — Assessment & Plan Note (Signed)
Summary: FU THYROID/[facility] $50   Vital Signs:  Patient profile:   45 year old female Menstrual status:  regular Height:      64 inches Weight:      172 pounds BMI:     29.63 O2 Sat:      97 % on Room air Temp:     97.5 degrees F oral Pulse rate:   67 / minute BP sitting:   108 / 68  (left arm) Cuff size:   regular  Vitals Entered By: Bill Salinas CMA (January 09, 2009 8:11 AM)  O2 Flow:  Room air CC: follow-up visit, pt here to discuss medication. She states she only takes the name brand, which is synthroid 75 mcg / ab   Primary Provider:  [name],MD  CC:  follow-up visit, pt here to discuss medication. She states she only takes the name brand, and which is synthroid 75 mcg / ab.  History of Present Illness: pt had i-131 rx for hyperthyroidism due to a multinodular goiter, in 2009.  f/u US showed improvement in the size of the goiter. for her post-radiation hypothyroidism, she takes synthroid 75/day.  she did not feel well on the 88 micrograms/day.    Current Medications (verified): 1)  Levothyroxine Sodium 88 Mcg Tabs (Levothyroxine Sodium) .Marland Kitchen.. 1 Qd  Allergies (verified): No Known Drug Allergies  Past History:  Past Medical History: Last updated: 07/25/2008  DEPRESSION (ICD-311) HYPERTHYROIDISM (ICD-242.90) rs 1 131 2009 GOITER, MULTINODULAR (ICD-241.1) DYSTHYMIA, SITUATIONAL (ICD-300.4) JOINT CREPITUS, KNEE (ICD-719.66) SYMPTOM, HEARTBURN (ICD-787.1) PALPITATIONS, RECURRENT (ICD-785.1) bronchiectasis lll from childhood pneumonia?  has bronch in about 1999. childbirth  Consults  Dr. Virgel Bouquet  Review of Systems  The patient denies weight loss and weight gain.    Physical Exam  General:  normal appearance.   Neck:  i again notice the right thyroid nodule,  approx 4 cm Additional Exam:  FastTSH                   4.67 uIU/mL       Impression & Recommendations:  Problem # 1:  HYPOTHYROIDISM, POST-RADIATION (ICD-244.1) well-replaced   Problem # 2:  GOITER, MULTINODULAR (ICD-241.1) Assessment: Improved  Other Orders: TLB-TSH (Thyroid Stimulating Hormone) (84443-TSH) Est. Patient Level III (96295)  Patient Instructions: 1)  continue synthroid 75 micrograms/day. 2)  return 4 mos. 3)  tests are being ordered for you today.  a few days after the test(s), please call [phone] to hear your test results.  this is very important to do because the results may change the instructions you see here. 4)  we discussed the fact that she is at risk for recurrence for overactive thyroid. 5)  (update: i left message on phone-tree:  rx as we discussed)
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
## 2010-06-27 NOTE — Miscellaneous (Signed)
Summary: Orders Update pft charges  Clinical Lists Changes  Orders: Added new Service order of Carbon Monoxide diffusing w/capacity (94720) - Signed Added new Service order of Lung Volumes (94240) - Signed Added new Service order of Spirometry (Pre & Post) (94060) - Signed 

## 2010-06-27 NOTE — Progress Notes (Signed)
Summary: iodine tx.  Phone Note Call from Patient Call back at Home Phone [phone]   Caller: Patient Call For: dr ellsion Summary of Call: per pt call want to go ahead an schedule her iodine tx. Initial call taken by: Shelbie Proctor,  September 13, 2007 8:53 AM  Follow-up for Phone Call        ordered Follow-up by: Minus Breeding MD,  September 13, 2007 9:07 AM

## 2010-06-27 NOTE — Assessment & Plan Note (Signed)
Summary: NEW PT/GOITER PER YLONDA/DR PANOSH-PC MD-NEW PKG $50- SELF PA...   Vital Signs:  Patient Profile:   45 Years Old Female Weight:      171.6 pounds Temp:     97.9 degrees F oral Pulse rate:   80 / minute BP sitting:   113 / 64  (right arm) Cuff size:   regular  Vitals Entered By: Orlan Leavens (August 16, 2007 11:03 AM)                 Visit Type:  Consult  Chief Complaint:  NEW ENDO/ REFERRED BY DR PANOSH/ GOITER.  History of Present Illness: pt states 18 months of depression, with associated weight gain and fatigue.  now she has few months of intermittent palpitaiotns in the chest, which have now resolved.    Current Allergies: No known allergies   Past Medical History:    Reviewed history from 07/30/2007 and no changes required:       Unremarkable       Childbirth     Review of Systems  The patient denies fever, syncope, suspicious skin lesions, and chest pain.         feels doe is due to deconditioning.  denies n/v/tremor.  she has intermittent headache and nocturnal diaphoresis.  she has slight itching--pt feels due to dry skin   Physical Exam  General:     healthy appearing.   Head:     normocephalic  Eyes:     no proptosis Neck:     multinodular goiter--5x normal on right, slightly enlarged on the left Lungs:     clear to auscultation.  no respiratory distress.  Heart:     regular rate and rhythm, S1, S2 without murmurs, rubs, gallops, or clicks Msk:     normal strength and gait Neurologic:     no tremor Skin:     not diaphoretic Psych:     alert and cooperative; normal mood and affect; normal attention span and concentration Additional Exam:     tsh=0.28    Impression & Recommendations:  Problem # 1:  GOITER, MULTINODULAR (ICD-241.1)  Problem # 2:  HYPERTHYROIDISM (ICD-242.90) mild, due to #1 Orders: Radiology Referral (Radiology) Consultation Level IV (16109)   Problem # 3:  DEPRESSION (ICD-311) probably not  thyroid-related   Patient Instructions: 1)  scan with plan for i-131    ]

## 2010-06-27 NOTE — Progress Notes (Signed)
Summary: back pain  Phone Note Call from Patient Call back at Work Phone [phone]   Caller: Patient Summary of Call: Pt is still having ongoing back pain in the kidney area and radiated to the front under breast area. Pt wants to go to [place] and have the urine and culture done there. So they can send it to [facility]. No antibiotic for 2 weeks. She is wanting to know if it's okay to live with the two strains of bacteria. Initial call taken by: Romualdo Bolk, CMA,  March 15, 2008 10:12 AM  Follow-up for Phone Call        Per Dr. Fabian Sharp- Pt needs to get a ua and culture. Pt aware and will go today. Follow-up by: Romualdo Bolk, CMA,  March 15, 2008 2:39 PM

## 2010-06-27 NOTE — Progress Notes (Signed)
  Phone Note Call from Patient Call back at Home Phone [phone]   Caller: Patient/[phone] Call For: Madelin Headings MD Summary of Call: per Carrolyn Leigh call have ? about lab results and to talk to him about her medication  Initial call taken by: Shelbie Proctor,  Oct 18, 2008 4:34 PM  Follow-up for Phone Call        i called pt 10/19/08.  no answer. Follow-up by: Minus Breeding MD,  Oct 19, 2008 12:47 PM  Additional Follow-up for Phone Call Additional follow up Details #1::        pt states her? was asnswered by her pharmasisit . Additional Follow-up by: Shelbie Proctor,  October 24, 2008 8:51 AM

## 2010-06-27 NOTE — Progress Notes (Signed)
Summary: PT RET YOUR CALL   Phone Note Call from Patient Call back at Home Phone [phone]   Caller: PT LIVE Call For: [facility]  Summary of Call: PT RETURNING YOUR CALL  Initial call taken by: Roselle Locus,  August 28, 2008 3:05 PM  Follow-up for Phone Call        Pt aware. Follow-up by: Romualdo Bolk, CMA,  August 28, 2008 3:49 PM

## 2010-06-27 NOTE — Assessment & Plan Note (Signed)
Summary: CPX WITH PAP//CCM   Vital Signs:  Patient profile:   45 year old female Menstrual status:  regular LMP:     01/22/2010 Height:      64 inches Weight:      165 pounds Pulse rate:   88 / minute BP sitting:   100 / 60  (right arm) Cuff size:   regular  Vitals Entered By: Romualdo Bolk, CMA Duncan Dull) (February 04, 2010 9:17 AM) CC: CPX with pap  LMP (date): 01/22/2010 LMP - Character: normal Menarche (age onset years): 9   Menses interval (days): 32 Menstrual flow (days): 5 Enter LMP: 01/22/2010   History of Present Illness: Lori Cunningham  comesin comes in today  for preventive visit   Issues : eczema  itchy rash left arm in patches  . hx of same as a child   no rx for now .   Catch in gluteal and hard to get better  1 week of signs    . onset when using a different bvacuum cleaner and pulled muscle a certain way.   hard to bend and lift  but walk ok    Sleep:    dr calnzc rec sleep study  Sept 18 the .   ? if stress.    ?  anxiety med would help.  seen [facility] and trazadone to sleep and zoloft..     On this  less   than a month 1 by mouth once daily  .   TO go back in a month.    for reassess  Preventive Care Screening  Prior Values:    Mammogram:  ASSESSMENT: Negative - BI-RADS 1^MM DIGITAL SCREENING (01/14/2010)    Colonoscopy:  Location:  [facility].   (02/21/2008)    Last Tetanus Booster:  Tdap (03/22/2007)   Preventive Screening-Counseling & Management  Alcohol-Tobacco     Alcohol drinks/day: <1     Smoking Status: quit     Year Quit: age 47  Caffeine-Diet-Exercise     Caffeine use/day: 2     Does Patient Exercise: no  Hep-HIV-STD-Contraception     Dental Visit-last 6 months yes     Sun Exposure-Excessive: no  Safety-Violence-Falls     Seat Belt Use: yes     Firearms in the Home: no firearms in the home     Smoke Detectors: yes  Contraindications/Deferment of Procedures/Staging:    Test/Procedure: FLU VAX    Reason for  deferment: patient declined   Current Medications (verified): 1)  Armour Thyroid 60 Mg Tabs (Thyroid) .... Take One By Mouth Once Daily 2)  Docusate Sodium 100 Mg Caps (Docusate Sodium) .... Take Three By Mouth At Bedtime 3)  Prilosec 20 Mg Cpdr (Omeprazole) .... Take  One 30-60 Min Before First Meal of The Day 4)  Melatonin 5)  5h Herbal Supplement 6)  B Complex  Tabs (B Complex Vitamins)  Allergies (verified): No Known Drug Allergies  Past History:  Care Management: Endocrinology: Dr. 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 2 daughters Former smoker. Quit in 1985. Smoked 1/2 ppd x 2 yrs. Alcohol Use - yes-socially    HH of 3   Illicit Drug Use - no Patient does not get regular exercise.  Daily Caffeine Use  Review of Systems  The patient denies anorexia, fever, weight loss, weight gain, vision loss, decreased  hearing, chest pain, syncope, prolonged cough, abdominal pain, muscle weakness, transient blindness, abnormal bleeding, enlarged lymph nodes, and angioedema.         still has palpitations.   Physical Exam  General:  Well-developed,well-nourished,in no acute distress; alert,appropriate and cooperative throughout examination Head:  normocephalic and atraumatic.   Eyes:  PERRL, EOMs full, conjunctiva clear  Ears:  R ear normal, L ear normal, and no external deformities.   Nose:  no external deformity, no external erythema, and no nasal discharge.   Mouth:  good dentition and pharynx pink and moist.   Neck:  supple and full ROM.  palpable thyroid nontender Chest Wall:  No deformities, masses, or tenderness noted. Breasts:  No mass, nodules, thickening, tenderness, bulging, retraction, inflamation, nipple discharge or skin changes noted.   Lungs:  Normal respiratory effort, chest expands symmetrically. Lungs are clear to  auscultation, no crackles or wheezes. Heart:  Normal rate and regular rhythm. S1 and S2 normal without gallop, murmur, click, rub or other extra sounds. Abdomen:  Bowel sounds positive,abdomen soft and non-tender without masses, organomegaly or hernias noted. Rectal:  No external abnormalities noted. Normal sphincter tone. No rectal masses or tenderness. Genitalia:  Pelvic Exam:        External: normal female genitalia without lesions or masses        Vagina: normal without lesions or masses        Cervix: normal without lesions or masses        Adnexa: normal bimanual exam without masses or fullness        Uterus: normal by palpation        Pap smear: performed Msk:  no joint swelling and no joint warmth.   tender left buttock area   rom back hip ok otherwise pain with bending  Pulses:  pulses intact without delay   Extremities:  no clubbing cyanosis or edema  Neurologic:  non focal  Skin:  right antecubital area  with 2-3 cm patch  of rash with indistinct border  red   Cervical Nodes:  No lymphadenopathy noted Axillary Nodes:  No palpable lymphadenopathy Inguinal Nodes:  No significant adenopathy Psych:  Oriented X3, normally interactive, good eye contact, not anxious appearing, and not depressed appearing.   reviewed labs  Impression & Recommendations:  Problem # 1:  PREVENTIVE HEALTH CARE (ICD-V70.0) Discussed nutrition,exercise,diet,healthy weight, vitamin D and calcium.  injury prevention  Problem # 2:  ROUTINE GYNECOLOGICAL EXAMINATION (ICD-V72.31) pap done nl exam  Problem # 3:  HYPOTHYROIDISM, POST-RADIATION (ICD-244.1) tsh upper range  check with Dr Emelda Brothers office about  plan Her updated medication list for this problem includes:    Armour Thyroid 60 Mg Tabs (Thyroid) .Marland Kitchen... Take one by mouth once daily  Problem # 4:  ECZEMA (ICD-692.9) Assessment: New agreee vs conact derm Her updated medication list for this problem includes:    Triamcinolone Acetonide 0.1 % Crea  (Triamcinolone acetonide) .Marland Kitchen... Apply to eczema  two times a day  Problem # 5:  MUSCLE STRAIN, LEFT BUTTOCK (ICD-848.8) Assessment: New left buttocks   disc  Expectant management and ice and stretch and aoidance of similar motion until all healed .  call if persistent or  progressive     Complete Medication List: 1)  Armour Thyroid 60 Mg Tabs (Thyroid) .... Take one by mouth once daily 2)  Docusate Sodium 100 Mg Caps (Docusate sodium) .... Take three by mouth at bedtime 3)  Prilosec 20 Mg Cpdr (Omeprazole) .... Take  one 30-60  min before first meal of the day 4)  Melatonin  5)  5h Herbal Supplement  6)  B Complex Tabs (B complex vitamins) 7)  Triamcinolone Acetonide 0.1 % Crea (Triamcinolone acetonide) .... Apply to eczema  two times a day 8)  Cyclobenzaprine Hcl 10 Mg Tabs (Cyclobenzaprine hcl) .Marland Kitchen.. 1 by mouth three times a day as needed msucle spasm 9)  Zoloft 50 Mg Tabs (Sertraline hcl) .Marland Kitchen.. 1 by mouth once daily per [facility] 10)  Trazodone Hcl 50 Mg Tabs (Trazodone hcl) .... Take at night ? dose  [facility]  Patient Instructions: 1)  try otc med 1% hcs two times a day for up to 2 weeks and if nobetter  use the stronger med. 2)  Ice  1-2 times per day  for muscle injury   and  ok to  add muscle realaxant as needed. 3)  expect  take 4-6 weeks to totally heal but should feell better in a week or 2  4)  Disc  anxiety rx with [facility] .  5)  will send copy of lab to Dr Lucianne Muss . 6)   call  their office to see if they want to change med dose or repeat test.  Prescriptions: CYCLOBENZAPRINE HCL 10 MG TABS (CYCLOBENZAPRINE HCL) 1 by mouth three times a day as needed msucle spasm  #40 x 0   Entered and Authorized by:   Madelin Headings MD   Signed by:   Madelin Headings MD on 02/04/2010   Method used:   Electronically to        [facility]* (retail)       [address] [place], [place]  [address]       Ph: [phone]       Fax: [phone]   RxID:    [id number] TRIAMCINOLONE ACETONIDE 0.1 % CREA (TRIAMCINOLONE ACETONIDE) apply to eczema  two times a day  #30 grams x 1   Entered and Authorized by:   Madelin Headings MD   Signed by:   Madelin Headings MD on 02/04/2010   Method used:   Electronically to        [facility]* (retail)       [address] [place], [place]  [address]       Ph: [phone]       Fax: [phone]   RxID:   [id number]

## 2010-06-27 NOTE — Miscellaneous (Signed)
Summary: Initial Summary for PT [facility]  Initial Summary for PT [facility]   Imported By: Maryln Gottron 09/08/2008 15:51:08  _____________________________________________________________________  External Attachment:    Type:   Image     Comment:   External Document

## 2010-06-27 NOTE — Progress Notes (Signed)
  Phone Note Call from Patient Call back at Home Phone [phone]   Caller: Patient Call For: Madelin Headings MD Summary of Call: per Carrolyn Leigh call states she had labs on monday and want results  Initial call taken by: Shelbie Proctor,  November 01, 2008 1:32 PM  Follow-up for Phone Call        do you mean the urine test?  if so, i'll let you know when the results are available Follow-up by: Minus Breeding MD,  November 01, 2008 2:14 PM  Additional Follow-up for Phone Call Additional follow up Details #1::        yes dr ellsion pt was talking about her urine results informed pt  the Dr will inform when results are available  Additional Follow-up by: Shelbie Proctor,  November 01, 2008 2:53 PM

## 2010-06-27 NOTE — Assessment & Plan Note (Signed)
Summary: YEARLY FOLLOW UP/YF    History of Present Illness Visit Type: Follow-up Visit Primary GI MD: [name] MD Primary Provider: Berniece Andreas, MD Requesting Provider: NA Chief Complaint: Patient having some epigastric pain after eating, off and on for about a month or two. Complains of lower bad pain and cramping and bloating when she eats veggies or fruit.Patient states that it  "Feels like she has a ballon in her stomach that wont pop" . History of Present Illness:   This is a 45 year old white female whom we have seen in the past for intermittent rectal bleeding attibuted to an anal fissure. There is no family history of colon cancer. We have also seen patient in the recent past due to a choking sensationg in her throat. She was treated in the past by Dr. Everardo All for a goiter and had a radioactive iodine treatment in May 2009. Patient had an endoscopy in 2009 for evaluation of the choking. That exam was negative and biopsies confirmed that there was no Barrett's esophagus. A colonoscopy done at that time was also a normal exam with the exception of an anal fissure. Patient comes today for a routine follow up.she continues to be constipated. Postprandially she develops bloating and distention. During the meals, she develops mid substernal chest pain. The pain is usually relieved by 2 Rolaids. Pain usually goes away within an hour or two after meals. She denies waking up at night coughing or regurgitating food.   GI Review of Systems    Reports abdominal pain, acid reflux, bloating, and  nausea.     Location of  Abdominal pain: epigastric area.    Denies chest pain, dysphagia with liquids, dysphagia with solids, heartburn, loss of appetite, vomiting, vomiting blood, weight loss, and  weight gain.      Reports constipation and  diarrhea.     Denies anal fissure, black tarry stools, change in bowel habit, diverticulosis, fecal incontinence, heme positive stool, hemorrhoids, irritable bowel  syndrome, jaundice, light color stool, liver problems, rectal bleeding, and  rectal pain. Preventive Screening-Counseling & Management  Caffeine-Diet-Exercise     Does Patient Exercise: no      Drug Use:  no.      Current Medications (verified): 1)  Armour Thyroid 60 Mg Tabs (Thyroid) .... Take One By Mouth Once Daily 2)  Docusate Sodium 100 Mg Caps (Docusate Sodium) .... Take Three By Mouth At Bedtime  Allergies (verified): No Known Drug Allergies  Past History:  Past Medical History: Reviewed history from 02/12/2009 and no changes required. DEPRESSION (ICD-311) HYPERTHYROIDISM (ICD-242.90) rs 1 131 2009 GOITER, MULTINODULAR (ICD-241.1) DYSTHYMIA, SITUATIONAL (ICD-300.4) JOINT CREPITUS, KNEE (ICD-719.66) SYMPTOM, HEARTBURN (ICD-787.1) PALPITATIONS, RECURRENT (ICD-785.1) bronchiectasis lll from childhood pneumonia?  has bronch in about 1999. childbirth  Consults  Dr. [name]  Past Surgical History: Carpal tunnel release bilateral    Sypher  Caesarean section  94 and 2000 Turbinate reduction deviated septum  shoemaker.  Bronchoscopy  in 1980s  bronchiesctesis  lll   Dr hopper Lucien Mons  Family History: Reviewed history from 02/12/2009 and no changes required. mom borderline  dm  cholesterol No known thyroid disease Family History of Aneurysm Aorticpaternal GF add hx mom had thyroid disease during time of pregnany loss.  No FH of Colon Cancer Fam Hx of CTS. no allergies  Social History: Divorced  recently , works in [employer] 2 daughters Former smoker. Quit in 1985. Smoked 1/2 ppd x 2 yrs. Alcohol Use - yes-socially    HH  of 3      Illicit Drug Use - no Patient does not get regular exercise.  Daily Caffeine Use Drug Use:  no  Review of Systems  The patient denies allergy/sinus, anemia, anxiety-new, arthritis/joint pain, back pain, blood in urine, breast changes/lumps, change in vision, confusion, cough, coughing up blood,  depression-new, fainting, fatigue, fever, headaches-new, hearing problems, heart murmur, heart rhythm changes, itching, menstrual pain, muscle pains/cramps, night sweats, nosebleeds, pregnancy symptoms, shortness of breath, skin rash, sleeping problems, sore throat, swelling of feet/legs, swollen lymph glands, thirst - excessive , urination - excessive , urination changes/pain, urine leakage, vision changes, and voice change.         Pertinent positive and negative review of systems were noted in the above HPI. All other ROS was otherwise negative.   Vital Signs:  Patient profile:   45 year old female Menstrual status:  regular Height:      64 inches Weight:      171.1 pounds BMI:     29.48 Pulse rate:   66 / minute Pulse rhythm:   regular BP sitting:   100 / 70  (left arm) Cuff size:   regular  Vitals Entered By: Harlow Mares CMA Duncan Dull) (March 13, 2009 3:26 PM)  Physical Exam  General:  Well developed, well nourished, no acute distress. Mouth:  No deformity or lesions, dentition normal. Neck:  Supple; no masses or thyromegaly. Lungs:  Clear throughout to auscultation. Heart:  Regular rate and rhythm; no murmurs, rubs,  or bruits. Abdomen:  soft abdomen with normoactive bowel sounds. No distention. Mild tenderness in the epigastrium and subxiphoid area lower abdomen is unremarkable Extremities:  No clubbing, cyanosis, edema or deformities noted. Skin:  Intact without significant lesions or rashes. Psych:  Alert and cooperative. Normal mood and affect.   Impression & Recommendations:  Problem # 1:  SYMPTOM, HEARTBURN (ICD-787.1) Patient has chest pain secondary to gastroesophageal reflux. An upper endoscopy  one year ago revealed a gastric islet  patch which was consistent with reflux esophagitis. She will start Prilosec 20 mg daily and may increase it to twice a day as necessary.  Problem # 2:  ANAL FISSURE (ICD-565.0) Patient has an asymptomatic anal fissure. She is status  post colonoscopy in September 2009. She has functional constipation. Patient will increase her Colace to 100 mg 3 a day with stimulant. I offered her MiraLax 17 g 2-3 times a week but she prefers to take pills. Her upper abdominal ultrasound 6 months ago was normal.  Patient Instructions: 1)  high-fiber diet. 2)  Prilosec 20 mg daily. 3)  Colace stimulant 3 tablets daily. 4)  MiraLax 17 g p.r.n. constipation. 5)  Increase physical activity. 6)  Copy sent to : Dr Berniece Andreas Prescriptions: PRILOSEC 20 MG CPDR (OMEPRAZOLE) Take 1 tablet by mouth once a day  #30 x 6   Entered by:   Hortense Ramal CMA (AAMA)   Authorized by:   Hart Carwin MD   Signed by:   Hortense Ramal CMA (AAMA) on 03/13/2009   Method used:   Electronically to        [facility]* (retail)       [address] [place], [place]  [address]       Ph: [phone]       Fax: [phone]   RxID:   [id number]

## 2010-06-27 NOTE — Letter (Signed)
Summary: [facility]   [facility]   Imported By: Roderic Ovens 05/22/2010 10:44:57  _____________________________________________________________________  External Attachment:    Type:   Image     Comment:   External Document

## 2010-06-27 NOTE — Progress Notes (Signed)
Summary: labs  Phone Note Call from Patient   Caller: Patient Reason for Call: Lab or Test Results Summary of Call: Had some labs work done week ago. req results. Initial call taken by: Orlan Leavens,  November 06, 2008 3:39 PM  Follow-up for Phone Call        Called pt inform her results was left on pt Follow-up by: Orlan Leavens,  November 06, 2008 3:41 PM

## 2010-06-27 NOTE — Assessment & Plan Note (Signed)
Summary: ? bladder infection/ok per dr Mack Alvidrez/ccm   Vital Signs:  Patient Profile:   45 Years Old Female Height:     63.25 inches Weight:      163 pounds Temp:     98.3 degrees F oral Pulse rate:   84 / minute BP sitting:   112 / 68  (right arm)                 PCP:  Malena Edman  Chief Complaint:  UTI.  History of Present Illness: Lori Cunningham is experiencing lower and middle back pain. As an acute  Possible uti work in . No dysuria or frequency and is active in job but no injury and not usually this kind of back ache.    No fever or abd pain.       She has been dieting only eating less than 1000 calories a day. Trying to lose weight . Balanced diet. but low carbs.  Synthroid  recently  added to meds.    felt badly about 5 days ago and achy without fever   Dr Everardo All rec  taking synthroid  .      Updated Prior Medication List: WELLBUTRIN XL 150 MG  TB24 (BUPROPION HCL) TAKE 1 three times a day QD ANUSOL-HC 25 MG  SUPP (HYDROCORTISONE ACETATE) Insert 1 suppository into rectum every night ANALPRAM-HC SINGLES 1-2.5 %  CREA (HYDROCORTISONE ACE-PRAMOXINE) Apply to rectum three times a day SYNTHROID 100 MCG TABS (LEVOTHYROXINE SODIUM) qd  Current Allergies (reviewed today): No known allergies   Past Medical History:    HEMORRHAGE OF RECTUM AND ANUS (ICD-569.3)    ROUTINE GYNECOLOGICAL EXAMINATION (ICD-V72.31)    DEPRESSION (ICD-311)    HYPERTHYROIDISM (ICD-242.90) rs 1 131 2009    GOITER, MULTINODULAR (ICD-241.1)    DYSTHYMIA, SITUATIONAL (ICD-300.4)    JOINT CREPITUS, KNEE (ICD-719.66)    SYMPTOM, HEARTBURN (ICD-787.1)    PALPITATIONS, RECURRENT (ICD-785.1)    FAMILY HISTORY OF ANEURYSM AORTIC (ICD-V17.4)        Social History:    Divorced  recently , 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     Physical Exam  General:     alert, well-developed, well-nourished, and well-hydrated.   Head:     normocephalic and atraumatic.   Neck:     No deformities, masses, or tenderness noted.thyroid palpable Lungs:     Normal respiratory effort, chest expands symmetrically. Lungs are clear to auscultation, no crackles or wheezes. Heart:     Normal rate and regular rhythm. S1 and S2 normal without gallop, murmur, click, rub or other extra sounds. Abdomen:     Bowel sounds positive,abdomen soft and non-tender without masses, organomegaly or hernias noted.  points to  bilateral back  lower areas no cva tenderness Extremities:     no cce  Skin:     turgor normal and color normal.   Cervical Nodes:     no anterior cervical adenopathy and no posterior cervical adenopathy.   Psych:     normally interactive and good eye contact.      Impression & Recommendations:  Problem # 1:  BACK PAIN (ICD-724.5) ?seems MS  but although atypical for her   she is hypothyroid now and on a low calorie diet  .  and ketotic  slightly  .    Problem # 2:  HYPOTHYROIDISM, POST-RADIATION (ICD-244.1) take meds Her updated medication list for this problem includes:    Synthroid 100 Mcg Tabs (Levothyroxine sodium) ..... Qd   Complete Medication List: 1)  Wellbutrin Xl 150 Mg Tb24 (Bupropion hcl) .... Take 1 three times a day qd 2)  Anusol-hc 25 Mg Supp (Hydrocortisone acetate) .... Insert 1 suppository into rectum every night 3)  Analpram-hc Singles 1-2.5 % Crea (Hydrocortisone ace-pramoxine) .... Apply to rectum three times a day 4)  Synthroid 100 Mcg Tabs (Levothyroxine sodium) .... Qd  Other Orders: UA Dipstick w/o Micro (automated)  (81003) T-Culture, Urine ([id number])   Patient Instructions: 1)  take synthroid  as directed increase water fluid and decrease caffeine as discussed  .  Try to maintain 1200 cal per day  and small amt of Carbs  . 2)  call in 1 week if ot improving. or  prn   ] Laboratory Results   Urine Tests   Date/Time Reported: January 26, 2008 3:27 PM   Routine Urinalysis   Color: yellow Appearance: Clear Glucose: negative   (Normal Range: Negative) Bilirubin: negative   (Normal Range: Negative) Ketone: 1+   (Normal Range: Negative) Spec. Gravity: 1.010   (Normal Range: 1.003-1.035) Blood: negative   (Normal Range: Negative) pH: 7.0   (Normal Range: 5.0-8.0) Protein: negative   (Normal Range: Negative) Urobilinogen: 0.2   (Normal Range: 0-1) Nitrite: negative   (Normal Range: Negative) Leukocyte Esterace: negative   (Normal Range: Negative)    Comments: Rita Ohara  January 26, 2008 3:27 PM

## 2010-06-27 NOTE — Assessment & Plan Note (Signed)
Summary: f3y/freq PVC's on sleep study      Allergies Added: NKDA  Visit Type:  Follow-up Referring Provider:  Dyanne Carrel, MD Primary Provider:  Berniece Andreas, MD  CC:  PVCs.  History of Present Illness: The patient presents for evaluation of premature ventricular contractions. These were noted recently during a sleep study. She was not found to have significant sleep apnea but was said to have "frequent" PVCs. I saw her greater than 3 years ago for palpitations at which time she was found to be hyperthyroid. She had was treated and is now on thyroid replacement therapy. I do note that her last TSH was within normal limits. She notices palpitations occasionally very this happens more after she eats. She also notices that if she awakes in the middle of the night she might feel her heart racing. She thinks this is all a stable pattern. Her palpitations have been more noticeable over 8 months but still in frequent happening 3 or 4 times in a row and then abating. She cannot bring them on. They still occur though she recently gave up caffeine. She does not report chest pressure, neck or arm discomfort. She does not report no shortness of breath, PND or orthopnea. She has had no weight gain or edema.  Current Medications (verified): 1)  Armour Thyroid 60 Mg Tabs (Thyroid) .... Take One By Mouth Once Daily 2)  Docusate Sodium 100 Mg Caps (Docusate Sodium) .... Take Three By Mouth At Bedtime 3)  Zoloft 50 Mg Tabs (Sertraline Hcl) .Marland Kitchen.. 1 By Mouth Once Daily Per [facility] 4)  Trazodone Hcl 50 Mg  Tabs (Trazodone Hcl) .... At Bedtime  Allergies (verified): No Known Drug Allergies  Past History:  Past Medical History: DEPRESSION (ICD-311) HYPERTHYROIDISM (ICD-242.90) rs 1 [id number] GOITER, MULTINODULAR (ICD-241.1) DYSTHYMIA, SITUATIONAL (ICD-300.4) JOINT CREPITUS, KNEE (ICD-719.66) SYMPTOM, HEARTBURN (ICD-787.1) PALPITATIONS, RECURRENT (ICD-785.1) bronchiectasis lll from childhood pneumonia?   had  bronch in about 1999     - PFT's wnl 04/12/09 Childbirth Sleep study  Consults  Dr. [name]  Past Surgical History: Reviewed history from 03/13/2009 and no changes required. Carpal tunnel release bilateral    Sypher  Caesarean section  94 and 2000 Turbinate reduction deviated septum  shoemaker.  Bronchoscopy  in 1980s  bronchiesctesis  lll   Dr hopper Lucien Mons  Family History: Mom borderline  dm  cholesterol No known thyroid disease Family History of Aneurysm Aorticpaternal GF Add hx mom had thyroid disease during time of pregnany loss.  No FH of Colon Cancer Fam Hx of CTS. No allergies Father sleepwalking   Social History: Reviewed history from 02/04/2010 and no changes required. Divorced  , works in [employer].  is currently a Consulting civil engineer. 2 daughters Former smoker. Quit in 1985. Smoked 1/2 ppd x 2 yrs. Alcohol Use - yes-socially    HH of 3   Illicit Drug Use - no Patient does not get regular exercise.  Daily Caffeine Use  Review of Systems       neck positive for reflux, occasional wheezing. Otherwise as stated in the history of present illness negative for all other systems.  Vital Signs:  Patient profile:   45 year old female Menstrual status:  regular Height:      64 inches Weight:      171 pounds BMI:     29.46 Pulse rate:   68 / minute Resp:     16 per minute BP sitting:   116 / 80  (right arm)  Vitals Entered By: Marrion Coy, CNA (May 21, 2010 4:17 PM)  Physical Exam  General:  Well developed, well nourished, in no acute distress. Head:  normocephalic and atraumatic Eyes:  PERRLA/EOM intact; conjunctiva and lids normal. Mouth:  Teeth, gums and palate normal. Oral mucosa normal. Neck:  Neck supple, no JVD. No masses, thyromegaly or abnormal cervical nodes. Chest Wall:  no deformities or breast masses noted Lungs:  Clear bilaterally to auscultation and percussion. Heart:  Non-displaced PMI, chest non-tender; regular  rate and rhythm, S1, S2 without murmurs, rubs or gallops. Carotid upstroke normal, no bruit. Normal abdominal aortic size, no bruits. Femorals normal pulses, no bruits. Pedals normal pulses. No edema, no varicosities. Abdomen:  Bowel sounds positive; abdomen soft and non-tender without masses, organomegaly, or hernias noted. No hepatosplenomegaly. Msk:  Back normal, normal gait. Muscle strength and tone normal. Extremities:  No clubbing or cyanosis. Neurologic:  Alert and oriented x 3. Skin:  Intact without lesions or rashes. Cervical Nodes:  no significant adenopathy Axillary Nodes:  no significant adenopathy Inguinal Nodes:  no significant adenopathy Psych:  Normal affect.   Impression & Recommendations:  Problem # 1:  PREMATURE VENTRICULAR CONTRACTIONS (ICD-427.69) The patient is not particularly symptomatic with this. At this point and we'll make sure she has a structurally normal heart. If this is the case I would suggest no change in therapy unless it becomes symptomatic. I will check an echocardiogram.  Problem # 2:  DYSPNEA (ICD-786.09) She actually thinks this is stable and not particularly problematic. At this point no further cardiovascular workup is suggested. Orders: Echocardiogram (Echo)  Problem # 3:  DEPRESSION (ICD-311) She reports that this is well treated. She will continue meds as listed.  Other Orders: EKG w/ Interpretation (93000)  Patient Instructions: 1)  Your physician recommends that you schedule a follow-up appointment as needed 2)  Your physician recommends that you continue on your current medications as directed. Please refer to the Current Medication list given to you today. 3)  Your physician has requested that you have an echocardiogram.  Echocardiography is a painless test that uses sound waves to create images of your heart. It provides your doctor with information about the size and shape of your heart and how well your heart's chambers and valves  are working.  This procedure takes approximately one hour. There are no restrictions for this procedure.

## 2010-06-27 NOTE — Assessment & Plan Note (Signed)
Summary: ongoing uti/ssc   Vital Signs:  Patient Profile:   45 Years Old Female Height:     63.25 inches Weight:      160 pounds Temp:     98.2 degrees F BP sitting:   112 / 78  (left arm) Cuff size:   regular  Vitals Entered By: Raechel Ache, RN (March 06, 2008 4:15 PM)                 PCP:  Malena Edman  Chief Complaint:  Has had mid-back pain for about 1 month. Treated with several abx for positive urine cx's and no better. Pain still in back and radiating to abd..  History of Present Illness: a 45 year old female seen today complaining of persistent mid back pain.  This has been present for least 4 weeks and seems to wax and wane.  She has had a number of urine cultures that have revealed a group B streptococci as well as Klebsiella and enterococcus.  She has been treated with antibiotic therapy to include amoxicillin, Cipro, and Macrodantin.  This seems to have little effect on the back pain and she really denies any symptoms of a lower urinary tract infection.  Denies any flank pain, fever or chills.  She has no nocturnal symptoms, such as fever, and persistent back pain.  She states her pain largely resolves through the night.  At the present time.  She has some minimal discomfort, but not severe.  She has had urine tract infections in the remote past with more typical symptoms.  She has been on some muscle relaxants, but no real anti-inflammatory drugs    Current Allergies: No known allergies      Review of Systems  The patient denies anorexia, fever, and weight loss.         no urinary frequency, dysuria   Physical Exam  General:     mildly overweight, alert, appropriate, no acute distress Ears:     External ear exam shows no significant lesions or deformities.  Otoscopic examination reveals clear canals, tympanic membranes are intact bilaterally without bulging, retraction, inflammation or discharge. Hearing is grossly normal bilaterally. Mouth:  Oral mucosa and oropharynx without lesions or exudates.  Teeth in good repair. Lungs:     Normal respiratory effort, chest expands symmetrically. Lungs are clear to auscultation, no crackles or wheezes. Heart:     Normal rate and regular rhythm. S1 and S2 normal without gallop, murmur, click, rub or other extra sounds. Abdomen:     Bowel sounds positive,abdomen soft and non-tender without masses, organomegaly or hernias noted. Msk:     no tenderness over the lumbar or thoracic spine.  No flank tenderness    Impression & Recommendations:  Problem # 1:  BACK PAIN (ICD-724.5) back pain, presently seems fairly mild and has not been improved with antibiotic therapy;  back pain seems atypical for lower urinary tract infection or pyelonephritis.  She has no symptoms of cystitis.  Options were discussed.  It was elected to treat with Aleve two twice daily for a week and observe her clinical response will consider further laboratory testing and repeat urine culture at that time.  Complete Medication List: 1)  Wellbutrin Xl 150 Mg Tb24 (Bupropion hcl) .... Take 1 three times a day qd 2)  Synthroid 100 Mcg Tabs (Levothyroxine sodium) .... Take 1 by mouth qd 3)  Macrobid 100 Mg Caps (Nitrofurantoin monohyd macro) .Marland Kitchen.. 1 by mouth two times a day   Patient  Instructions: 1)  Please schedule a follow-up appointment as needed. 2)  Take 400-600mg  of Ibuprofen (Advil, Motrin) with food every 4-6 hours as needed for relief of pain or comfort of fever.   ]

## 2010-06-27 NOTE — Progress Notes (Signed)
Summary: ua  results  Phone Note Call from Patient Call back at Home Phone [phone]   Caller: Patient Call For: ann Summary of Call: pt called for her results of he ua  that was done at [facility] faxed over report , ann to call pt . pt was made aware once the results are review we will give her a call Initial call taken by: Shelbie Proctor,  September 07, 2007 9:58 AM    Additional Follow-up for Phone Call Additional follow up Details #2::    appt. made w/ dr Everardo All 4/20 todiscuss results and iodine tx. Follow-up by: Maris Berger,  September 07, 2007 3:06 PM

## 2010-06-27 NOTE — Progress Notes (Signed)
Summary: referral  Phone Note Call from Patient   Caller: Patient Call For: Madelin Headings MD Summary of Call: Pt is asking for a referral to a specialist to help her sleep.  [phone] Initial call taken by: Lynann Beaver CMA,  December 31, 2009 9:20 AM  Follow-up for Phone Call        Order sent to [facility]. Pt aware that order was sent to [facility]. Follow-up by: Romualdo Bolk, CMA (AAMA),  December 31, 2009 9:38 AM

## 2010-06-27 NOTE — Progress Notes (Signed)
  Phone Note Call from Patient Call back at Home Phone [phone]   Caller: Patient Call For: Lori Headings MD Summary of Call: per Carrolyn Leigh call  have   Thyroid lesions  want  to be seen today Next Appointment: 11/30/2008.Marland Kitchen also will drop off a report of a mri for dr ellsion to review re her  Thyroid lesions.  on dr ellsion desk Initial call taken by: Shelbie Proctor,  November 20, 2008 10:27 AM  Follow-up for Phone Call        ov today Follow-up by: Minus Breeding MD,  November 20, 2008 11:47 AM  Additional Follow-up for Phone Call Additional follow up Details #1::        called pt to inform she can be seen today  Additional Follow-up by: Shelbie Proctor,  November 20, 2008 12:03 PM

## 2010-06-27 NOTE — Letter (Signed)
Summary: [facility]  [facility]   Imported By: Maryln Gottron 03/08/2009 13:44:42  _____________________________________________________________________  External Attachment:    Type:   Image     Comment:   External Document
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
## 2010-06-27 NOTE — Progress Notes (Signed)
Summary: bacterial infection no better  Phone Note Call from Patient Call back at Work Phone [phone]   Caller: pt live Call For: Jevin Camino  Summary of Call: she is taking meds for bacterial infection and the symptoms are not improving.  She is also having pain on the side as well as the back.   Initial call taken by: Roselle Locus,  February 18, 2008 9:30 AM  Follow-up for Phone Call        per dr Fabian Sharp her final culture  show 2 germ s   the one from before and the current one.  so stay on cipro  and add macrobid 1 pobid for 7 more days.  Follow-up by: Windell Norfolk,  February 18, 2008 10:31 AM    New/Updated Medications: MACROBID 100 MG CAPS (NITROFURANTOIN MONOHYD MACRO) 1 by mouth two times a day   Prescriptions: MACROBID 100 MG CAPS (NITROFURANTOIN MONOHYD MACRO) 1 by mouth two times a day  #14 x 0   Entered by:   Windell Norfolk   Authorized by:   Romualdo Bolk, CMA   Signed by:   Windell Norfolk on 02/18/2008   Method used:   Telephoned to ...       [facility]* (retail)       [id number] [place], [place]  [address]       Ph: [phone]       Fax: [phone]   RxID:   [id number]

## 2010-06-27 NOTE — Progress Notes (Signed)
Summary: lab results  Phone Note Call from Patient Call back at Home Phone [phone]   Caller: [phone] Call For: dr ellsion Summary of Call: per pt call need lab results [facility]) Initial call taken by: Shelbie Proctor,  January 05, 2008 10:16 AM  Follow-up for Phone Call        thyroid slightly low take synthroid 50/d ret 6 weeks i have sent rx to pharmacy Follow-up by: Minus Breeding MD,  January 05, 2008 11:41 AM  Additional Follow-up for Phone Call Additional follow up Details #1::        January 05, 2008 1:27 PM called pt to inform of the results spoke with pt Additional Follow-up by: Shelbie Proctor,  January 05, 2008 1:28 PM

## 2010-06-27 NOTE — Assessment & Plan Note (Signed)
Summary: CONCERNS WITH SLEEP APNEA? // RS  pt rsc/njr   Vital Signs:  Patient profile:   45 year old female Menstrual status:  regular LMP:     06/27/2009 Weight:      170 pounds Pulse rate:   78 / minute BP sitting:   110 / 70  (left arm) Cuff size:   regular  Vitals Entered By: Romualdo Bolk, CMA (AAMA) (July 16, 2009 8:15 AM) CC: Discuss ? Sleep Apnea LMP (date): 06/27/2009 LMP - Character: normal Menarche (age onset years): 9   Menses interval (days): 32 Menstrual flow (days): 5 Enter LMP: 06/27/2009   History of Present Illness: Lori Cunningham comesin today for    concerns about sleep. For the last 2 years  awakens about 2 a m  and now seems its a habit.    Ans now increase.  Sometimes heart is racing.  irreg sleep since children born  but this is different.  But can  doze  anxious when wakens No problem falling asleep.  but awakens.  some dry throat  no choking but  >? if resp problem.  Last caffiene 4-5 pm. ? hx of snoring when married .   Has had deviated septum surgery.  and breathing with mouth closed.  Father has  bad sleep issues  sleepwalks  and snore s.   ? if started when thyroid was off.   but  steady dose after a year.   In the past   was  tried [place] for about 6-12 month  but affecting her during the day.    remote hx during stress of 1/4 of a xanax with help but this is a different situation.   Preventive Screening-Counseling & Management  Alcohol-Tobacco     Alcohol drinks/day: <1     Smoking Status: quit     Year Quit: age 61  Caffeine-Diet-Exercise     Caffeine use/day: 2     Does Patient Exercise: no  Current Medications (verified): 1)  Armour Thyroid 60 Mg Tabs (Thyroid) .... Take One By Mouth Once Daily 2)  Docusate Sodium 100 Mg Caps (Docusate Sodium) .... Take Three By Mouth At Bedtime 3)  Prilosec 20 Mg Cpdr (Omeprazole) .... Take  One 30-60 Min Before First Meal of The Day  Allergies (verified): No Known Drug  Allergies  Past History:  Past medical, surgical, family and social histories (including risk factors) reviewed, and no changes noted (except as noted below).  Past Medical History: Reviewed history from 04/12/2009 and no changes required. DEPRESSION (ICD-311) HYPERTHYROIDISM (ICD-242.90) rs 1 131 2009 GOITER, MULTINODULAR (ICD-241.1) DYSTHYMIA, SITUATIONAL (ICD-300.4) JOINT CREPITUS, KNEE (ICD-719.66) SYMPTOM, HEARTBURN (ICD-787.1) PALPITATIONS, RECURRENT (ICD-785.1) bronchiectasis lll from childhood pneumonia?  had  bronch in about 1999     - PFT's wnl 04/12/09 childbirth  Consults  Dr. [name]  Past Surgical History: Reviewed history from 03/13/2009 and no changes required. Carpal tunnel release bilateral    Sypher  Caesarean section  94 and 2000 Turbinate reduction deviated septum  shoemaker.  Bronchoscopy  in 1980s  bronchiesctesis  lll   Dr hopper Lucien Mons  Past History:  Care Management: Endocrinology: Dr. Everardo All- In the past; Lucianne Muss [place]: Tomasa Rand- In the past Pulmonary : [facility]  Family History: Reviewed history from 03/13/2009 and no changes required. mom borderline  dm  cholesterol No known thyroid disease Family History of Aneurysm Aorticpaternal GF add hx mom had thyroid disease during time of pregnany loss.  No FH of Colon Cancer Fam  Hx of CTS. no allergies Father sleepwalking   Social History: Reviewed history from 03/13/2009 and no changes required. Divorced  recently , works in [employer] 2 daughters Former smoker. Quit in 1985. Smoked 1/2 ppd x 2 yrs. Alcohol Use - yes-socially    HH of 3      Illicit Drug Use - no Patient does not get regular exercise.  Daily Caffeine Use  Review of Systems  The patient denies anorexia, fever, weight loss, weight gain, vision loss, decreased hearing, transient blindness, difficulty walking, unusual weight change, abnormal bleeding, and enlarged lymph nodes.         ocass  pm  palpitations  Physical Exam  General:  well-developed, well-nourished, and well-hydrated. mildly sleepy.  in nad   Head:  normocephalic and atraumatic.   Neck:  supple and full ROM.  palpable thyroid nontender Lungs:  Normal respiratory effort, chest expands symmetrically. Lungs are clear to auscultation, no crackles or wheezes.no dullness.   Heart:  normal rate, regular rhythm, no murmur, no gallop, and no rub.   Abdomen:  Bowel sounds positive,abdomen soft and non-tender without masses, organomegaly or  noted. Extremities:  no clubbing cyanosis or edema  Neurologic:  non focal  Skin:  turgor normal, color normal, no ecchymoses, no petechiae, and no purpura.   Cervical Nodes:  No lymphadenopathy noted Psych:  Oriented X3, good eye contact, not anxious appearing, and not depressed appearing.     Impression & Recommendations:  Problem # 1:  PERSISTENT DISORDER INITIATING/MAINTAINING SLEEP (ICD-307.42) no problem falling asleep but awakenings without obvious cause although may have aggravated  when thyroid was off.  OSA poss suppose  but could be habit wakening also.   had some after signs with Remus Loffler  will give a short trial of lunesta   3 days in a row and monitor .  call in about 2 weeks and can do sleep referral if appropriate.   Problem # 2:  HYPOTHYROIDISM, POST-RADIATION (ICD-244.1)  Her updated medication list for this problem includes:    Armour Thyroid 60 Mg Tabs (Thyroid) .Marland Kitchen... Take one by mouth once daily  Complete Medication List: 1)  Armour Thyroid 60 Mg Tabs (Thyroid) .... Take one by mouth once daily 2)  Docusate Sodium 100 Mg Caps (Docusate sodium) .... Take three by mouth at bedtime 3)  Prilosec 20 Mg Cpdr (Omeprazole) .... Take  one 30-60 min before first meal of the day 4)  Lunesta 3 Mg Tabs (Eszopiclone) .Marland Kitchen.. 1 by mouth hs as needed  Patient Instructions: 1)  limit  caffiene   and none after 3 pm 2)  Do a sleep diary for 2 weeks. also 3)  trial of short term  lunesta   4)  If not improving then   plan sleep referral( pulmonary) 5)  Call.  for referral.

## 2010-06-27 NOTE — Progress Notes (Signed)
Summary: tsh  Phone Note Call from Patient Call back at Home Phone [phone]   Caller: Patient Reason for Call: Lab or Test Results Summary of Call: Pt req lab results back for TSH that was done yesterday Initial call taken by: Orlan Leavens,  January 10, 2009 4:50 PM  Follow-up for Phone Call        TSH value is normal - dr Runell Gess should review further regarding any further action or advice Follow-up by: Corwin Levins MD,  January 10, 2009 6:08 PM  Additional Follow-up for Phone Call Additional follow up Details #1::        Notified pt Additional Follow-up by: Orlan Leavens,  January 11, 2009 10:03 AM

## 2010-06-27 NOTE — Progress Notes (Signed)
Summary: brand name  Phone Note From Pharmacy   Caller: [facility]* Summary of Call: Have a rx for levothyroxine patient is requesting the brand name(synthroid). Is this ok if so need new rx sent to [facility] Initial call taken by: Orlan Leavens,  August 17, 2008 2:13 PM  Follow-up for Phone Call        sent Follow-up by: Minus Breeding MD,  August 17, 2008 2:17 PM  Additional Follow-up for Phone Call Additional follow up Details #1::        informed pt that rx was sent Additional Follow-up by: Shelbie Proctor,  August 17, 2008 2:23 PM    New/Updated Medications: SYNTHROID 100 MCG TABS (LEVOTHYROXINE SODIUM) take 1 by mouth once daily daw   Appended Document: brand name    Clinical Lists Changes  Medications: Changed medication from SYNTHROID 100 MCG TABS (LEVOTHYROXINE SODIUM) take 1 by mouth once daily daw to SYNTHROID 100 MCG TABS (LEVOTHYROXINE SODIUM) take 1 by mouth once daily daw [BMN] - Signed Rx of SYNTHROID 100 MCG TABS (LEVOTHYROXINE SODIUM) take 1 by mouth once daily daw;  #100 x 1 Brand medically necessary;  Signed;  Entered by: Orlan Leavens;  Authorized by: Minus Breeding MD;  Method used: Electronically to [facility]*, [address] [place], [place], [place]  [address], Ph: [phone], Fax: [phone]    Prescriptions: SYNTHROID 100 MCG TABS (LEVOTHYROXINE SODIUM) take 1 by mouth once daily daw Brand medically necessary #100 x 1   Entered by:   Orlan Leavens   Authorized by:   Minus Breeding MD   Signed by:   Orlan Leavens on 08/22/2008   Method used:   Electronically to        [facility]* (retail)       [address] [place], [place]  [address]       Ph: [phone]       Fax: [phone]   RxID:   [id number]

## 2010-06-27 NOTE — Progress Notes (Signed)
Summary: [place] results  Phone Note Call from Patient Call back at Home Phone [phone]   Caller: Patient Summary of Call: PT IS CALLING REGARDING BLOODWORK RESULTS Initial call taken by: Heron Sabins,  August 08, 2008 10:32 AM  Follow-up for Phone Call        Pt would like to know about results of [place]. I read pt the report and told her that I would call her once Dr. Fabian Sharp signs off on the report. Follow-up by: Romualdo Bolk, CMA,  August 08, 2008 1:04 PM  Additional Follow-up for Phone Call Additional follow up Details #1::        Pt aware of what md said about the [place]. Additional Follow-up by: Romualdo Bolk, CMA,  August 08, 2008 2:45 PM

## 2010-06-27 NOTE — Assessment & Plan Note (Signed)
Summary: [name]/CCM   Vital Signs:  Patient Profile:   45 Years Old Female Height:     63.25 inches Weight:      160.2 pounds Pulse rate:   72 / minute BP sitting:   112 / 62  (left arm) Cuff size:   regular  Vitals Entered By: Romualdo Bolk, CMA (February 11, 2008 3:56 PM)                 PCP:  Malena Edman  Chief Complaint:  Follow up.  History of Present Illness: Lori Cunningham is here for a follow up on her uti. Pt states that she is better in back but still having left flank area back pain and wants to discuss group b strep that she had.  No fever . Thyroid better .   No new signs . NO fever Gi  signs .  No hematuria.  Discomfort in better in the am and worse as the day goes on.  Off amox for the last 2 days an no relapse. NO cough or sob. Has a chronic wheeze on exam left. no prurulent cough.    Prior Medications Reviewed Using: Patient Recall  Updated Prior Medication List: WELLBUTRIN XL 150 MG  TB24 (BUPROPION HCL) TAKE 1 three times a day QD SYNTHROID 100 MCG TABS (LEVOTHYROXINE SODIUM) take 1 by mouth qd  Current Allergies (reviewed today): No known allergies   Past Medical History:    HEMORRHAGE OF RECTUM AND ANUS (ICD-569.3)    DEPRESSION (ICD-311)    HYPERTHYROIDISM (ICD-242.90) rs 1 131 2009    GOITER, MULTINODULAR (ICD-241.1)    DYSTHYMIA, SITUATIONAL (ICD-300.4)    JOINT CREPITUS, KNEE (ICD-719.66)    SYMPTOM, HEARTBURN (ICD-787.1)    PALPITATIONS, RECURRENT (ICD-785.1)    FAMILY HISTORY OF ANEURYSM AORTIC (ICD-V17.4)     bronchiectasis lll from childhood pneumonia?  has bronch in about 1999.    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     Physical Exam  General:     alert, well-developed,  well-nourished, and well-hydrated.   Head:     normocephalic and atraumatic.   Lungs:     normal respiratory effort, no intercostal retractions, no accessory muscle use, normal breath sounds, and rare tubular sound left base  no rales  Heart:     Normal rate and regular rhythm. S1 and S2 normal without gallop, murmur, click, rub or other extra sounds. Abdomen:     ? if laft flank pain Msk:     no bony spine tenderness Extremities:     gait normal  Skin:     turgor normal and color normal.   Psych:     normally interactive, good eye contact, and not anxious appearing.      Impression & Recommendations:  Problem # 1:  BACK PAIN (ICD-724.5) better but now just left . still acts like  MS but has other possibilities. reculture.  call if fever etc.  no evedence of Pulm infection today.  Flu Vaccine Consent Questions     Do you have a history of severe allergic reactions to this vaccine? no    Any prior history of  allergic reactions to egg and/or gelatin? no    Do you have a sensitivity to the preservative Thimersol? no    Do you have a past history of Guillan-Barre Syndrome? no    Do you currently have an acute febrile illness? no    Have you ever had a severe reaction to latex? no    Vaccine information given and explained to patient? yes    Are you currently pregnant? no    Lot Number:[id number]   Site Given  Left Deltoid IM Romualdo Bolk, CMA  February 11, 2008 5:02 PM  Orders: UA Dipstick w/o Micro (automated)  (81003)   Problem # 2:  Hx of BRONCHIECTASIS LEFT LOWER LOBE (ICD-494.0)  Problem # 3:  UTI (ICD-599.0) ? resolved   disc gp b beta strep   Orders: T-Culture, Urine ([address]-60454)   Problem # 4:  HYPOTHYROIDISM, POST-RADIATION (ICD-244.1)  Her updated medication list for this problem includes:    Synthroid 100 Mcg Tabs (Levothyroxine sodium) .Marland Kitchen... Take 1 by mouth qd   Complete Medication List: 1)  Wellbutrin Xl 150 Mg Tb24 (Bupropion hcl) .... Take 1  three times a day qd 2)  Synthroid 100 Mcg Tabs (Levothyroxine sodium) .... Take 1 by mouth qd  Other Orders: Flu Vaccine 39yrs + (09811) Admin of Therapeutic Inj (IM or [place]) (91478)   Patient Instructions: 1)  will call with cx results and follow up as appropriate   2)  call if not improving or as needed worse   ] Laboratory Results   Urine Tests   Date/Time Reported: February 11, 2008 4:04 PM   Routine Urinalysis   Color: yellow Appearance: Clear Glucose: negative   (Normal Range: Negative) Bilirubin: negative   (Normal Range: Negative) Ketone: negative   (Normal Range: Negative) Spec. Gravity: 1.015   (Normal Range: 1.003-1.035) Blood: negative   (Normal Range: Negative) pH: 7.0   (Normal Range: 5.0-8.0) Protein: negative   (Normal Range: Negative) Urobilinogen: 0.2   (Normal Range: 0-1) Nitrite: negative   (Normal Range: Negative) Leukocyte Esterace: negative   (Normal Range: Negative)    Comments: Rita Ohara  February 11, 2008 4:04 PM

## 2010-06-27 NOTE — Progress Notes (Signed)
  Phone Note Call from Patient Call back at Home Phone [phone]   Caller: Patient/[phone] Call For: dr ellsion Summary of Call: per pt call began taking synthroid last week have  had 2 menstrual cycle  within 16 days ,feel week ,need to know what the dr think about this sx . per pt taking the synthorid  1 tab a day should she continue please advise Initial call taken by: Shelbie Proctor,  January 11, 2008 9:58 AM  Follow-up for Phone Call        ok to cont the synthroid as it is very unlikely this is related Follow-up by: Corwin Levins MD,  January 11, 2008 10:12 AM  Additional Follow-up for Phone Call Additional follow up Details #1::        January 11, 2008 11:23 AM  called pt to inform left msg on machine  Additional Follow-up by: Shelbie Proctor,  January 11, 2008 11:23 AM

## 2010-06-27 NOTE — Letter (Signed)
Summary: [facility]  [facility]   Imported By: Maryln Gottron 10/05/2008 08:05:32  _____________________________________________________________________  External Attachment:    Type:   Image     Comment:   External Document

## 2010-06-27 NOTE — Progress Notes (Signed)
Summary: ultra sound results  Phone Note Call from Patient Call back at [phone]   Caller: pt live Call For: Panosh Summary of Call: patient wants results of her ultrasound. Initial call taken by: Celine Ahr,  August 08, 2008 1:01 PM  Follow-up for Phone Call        Phone Call Completed see first triage message. Follow-up by: Romualdo Bolk, CMA,  August 08, 2008 1:05 PM

## 2010-06-27 NOTE — Progress Notes (Signed)
Summary: can pt take claritin with meds  Phone Note Call from Patient Call back at Home Phone [phone]   Caller: Patient Summary of Call: Pt is wanting to know if she can take claritin with her celexa and synthroid. Pt is having allergy symptoms and needs some relief. Initial call taken by: Romualdo Bolk, CMA,  September 01, 2008 9:05 AM  Follow-up for Phone Call        yes  could also try zyrtec although could cause drowsiness Follow-up by: Madelin Headings MD,  September 01, 2008 9:50 AM  Additional Follow-up for Phone Call Additional follow up Details #1::        Pt aware. Additional Follow-up by: Romualdo Bolk, CMA,  September 01, 2008 9:53 AM

## 2010-06-27 NOTE — Assessment & Plan Note (Signed)
Summary: PER DEBRA PER MD-SCHED DOUBLEBOOK--THYROID LESION-STC   Vital Signs:  Patient profile:   45 year old female Menstrual status:  regular Height:      64 inches Weight:      171 pounds BMI:     29.46 Temp:     97.7 degrees F oral Pulse rate:   73 / minute BP sitting:   100 / 72  (left arm) Cuff size:   regular  Vitals Entered By: Bill Salinas CMA (November 20, 2008 2:34 PM) CC: ov workin to discuss thyroid Lesions/ ab   Primary Provider:  [name],MD  CC:  ov workin to discuss thyroid Lesions/ ab.  History of Present Illness: pt says she has continued to feel tired since the reduction of her synthroid. pt says mri incidentally raised the ? of a new thyroid nodule.  Current Medications (verified): 1)  Levothyroxine Sodium 75 Mcg Tabs (Levothyroxine Sodium) .Marland Kitchen.. 1 Qd  Allergies: No Known Drug Allergies  Past History:  Past Medical History: Last updated: 07/25/2008  DEPRESSION (ICD-311) HYPERTHYROIDISM (ICD-242.90) rs 1 131 2009 GOITER, MULTINODULAR (ICD-241.1) DYSTHYMIA, SITUATIONAL (ICD-300.4) JOINT CREPITUS, KNEE (ICD-719.66) SYMPTOM, HEARTBURN (ICD-787.1) PALPITATIONS, RECURRENT (ICD-785.1) bronchiectasis lll from childhood pneumonia?  has bronch in about 1999. childbirth  Consults  Dr. Virgel Bouquet  Review of Systems       denies anxiety and palpitations  Physical Exam  General:  normal appearance.   Neck:  i again notice the right thyroid nodule,  approx 4 cm Additional Exam:  test results are reviewed:  FastTSH              [H]  6.21 uIU/mL      THYROID ULTRASOUND  1.  Dominant right-sided nodule is more solid on the current exam, but measures slightly smaller.  Correlate with the results of ultrasound biopsy of 09/01/2007. 2.  Other smaller thyroid lesions are no longer appreciated. 3.  Diminutive left lobe, possibly related to a hyperfunctioning right-sided nodule and/or the submitted history of prior radioactive iodine therapy.      Impression & Recommendations:  Problem # 1:  HYPOTHYROIDISM, POST-RADIATION (ICD-244.1) needs increased rx  Problem # 2:  GOITER, MULTINODULAR (ICD-241.1) Assessment: Improved  Medications Added to Medication List This Visit: 1)  Levothyroxine Sodium 88 Mcg Tabs (Levothyroxine sodium) .Marland Kitchen.. 1 qd  Other Orders: TLB-TSH (Thyroid Stimulating Hormone) ([address]657-QIO) Radiology Referral (Radiology) Est. Patient Level III (96295)  Patient Instructions: 1)  same synthroid for now (75/d), pending test results 2)  return 4 mos 3)  recheck [place] 4)  tests are being ordered for you today.  a few days after the test(s), please call [phone] to hear your test results.  this is very important to do because the results may change the instructions you see here 5)  (update: i left message on phone-tree:  increase synthroid to 88 micrograms/d) Prescriptions: LEVOTHYROXINE SODIUM 88 MCG TABS (LEVOTHYROXINE SODIUM) 1 qd  #90 x 1   Entered and Authorized by:   Minus Breeding MD   Signed by:   Minus Breeding MD on 11/20/2008   Method used:   Electronically to        [facility]* (retail)       [address] [place], [place]  [address]       Ph: [phone]       Fax: [phone]   RxID:   [id number]

## 2010-06-27 NOTE — Assessment & Plan Note (Signed)
Summary: cpx/mhf/pt rescd//ccm   Vital Signs:  Patient profile:   45 year old female Menstrual status:  regular LMP:     01/02/2009 Height:      64 inches Weight:      174 pounds Pulse rate:   60 / minute BP sitting:   110 / 70  (left arm) Cuff size:   regular  Vitals Entered By: Romualdo Bolk, CMA (AAMA) (January 22, 2009 8:17 AM) CC: CPX with pap LMP (date): 01/02/2009 LMP - Character: normal Menarche (age onset): 9 years  Menses interval: 32 days  Menstrual flow (days) 5 Enter LMP: 01/02/2009   History of Present Illness: Lori Cunningham comes in today for     cpx with pap.  Thyroid:     stable dose s os far.  Concerned about "wheezing" a lot .    Remote hx of  brocnhiectesis.   Inhalers make her jittery. NO  reg basis.  No recent    some nasal stuffiness   returning?   after nose surgery turbinate reduction . No   Cough .         remote hx of  spirometry results availabe. No dx of asthma  . No episodes of wheezing  just feels heavy chest  in am . Works [employer] in job including chlorox.  Denies specific  increase with exposures.   Preventive Screening-Counseling & Management  Alcohol-Tobacco     Alcohol drinks/day: <1     Smoking Status: quit     Year Quit: age 29  Caffeine-Diet-Exercise     Caffeine use/day: 2     Does Patient Exercise: no  Hep-HIV-STD-Contraception     Dental Visit-last 6 months yes     Sun Exposure-Excessive: no  Safety-Violence-Falls     Seat Belt Use: yes     Firearms in the Home: no firearms in the home     Smoke Detectors: yes  Current Medications (verified): 1)  Levothyroxine Sodium 75 Mcg Tabs (Levothyroxine Sodium) .Marland Kitchen.. 1 By Mouth Once Daily  Allergies (verified): No Known Drug Allergies  Past History:  Past medical, surgical, family and social histories (including risk factors) reviewed, and no changes noted (except as noted below).  Past Medical History: Reviewed history from 07/25/2008 and no  changes required.  DEPRESSION (ICD-311) HYPERTHYROIDISM (ICD-242.90) rs 1 131 2009 GOITER, MULTINODULAR (ICD-241.1) DYSTHYMIA, SITUATIONAL (ICD-300.4) JOINT CREPITUS, KNEE (ICD-719.66) SYMPTOM, HEARTBURN (ICD-787.1) PALPITATIONS, RECURRENT (ICD-785.1) bronchiectasis lll from childhood pneumonia?  has bronch in about 1999. childbirth  Consults  Dr. Virgel Bouquet  Past Surgical History: Carpal tunnel release right    Sypher  Caesarean section  94 and 2000 Turbinate reduction deviated septum  shoemaker.  Bronchoscopy  in 1980s  bronchiesctesis  lll   Dr hopper Lucien Mons  Family History: Reviewed history from 10/04/2008 and no changes required. mom borderline  dm  cholesterol No known thyroid disease Family History of Aneurysm Aorticpaternal GF add hx mom had thyroid disease during time of pregnany loss.  No FH of Colon Cancer Fam Hx of CTS.    Social History: Reviewed history from 07/25/2008 and no changes required. 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 x 2 years as a teen Alcohol Use - yes-socially    HH of 3     Seat Belt Use:  yes Dental Care w/in 6 mos.:  yes Sun Exposure-Excessive:  no  Review of Systems  The patient denies anorexia, fever, weight loss, weight gain, vision loss,  decreased hearing, hoarseness, chest pain, syncope, peripheral edema, prolonged cough, headaches, hemoptysis, abdominal pain, melena, hematochezia, severe indigestion/heartburn, hematuria, incontinence, genital sores, muscle weakness, suspicious skin lesions, transient blindness, difficulty walking, depression, unusual weight change, abnormal bleeding, enlarged lymph nodes, angioedema, and breast masses.   Physical Exam General Appearance: well developed, well nourished, no acute distress Eyes: conjunctiva and lids normal, PERRLA, EOMI, WNL Ears, Nose, Mouth, Throat: TM clear, nares clear, oral exam WNL Neck: supple, no lymphadenopathy,  thyroid palpable no  nodules , no JVD Respiratory: clear to auscultation and percussion, respiratory effort normal Cardiovascular: regular rate and rhythm, S1-S2, no murmur, rub or gallop, no bruits, peripheral pulses normal and symmetric, no cyanosis, clubbing, edema or varicosities Chest: no scars, masses, tenderness; no asymmetry, skin changes, nipple discharge   Gastrointestinal: soft, non-tender; no hepatosplenomegaly, masses; active bowel sounds all quadrants, guaiac negative stool; no masses, tenderness, hemorrhoids  Genitourinary: no vaginal discharge, lesions; no masses or tenderness PAP done Lymphatic: no cervical, axillary or inguinal adenopathy Musculoskeletal: gait normal, muscle tone and strength WNL, no joint swelling, effusions, discoloration, crepitus  Skin: clear, good turgor, color WNL, no rashes, lesions, or ulcerations Neurologic: normal mental status, normal reflexes, normal strength, sensation, and motion Psychiatric: alert; oriented to person, place and time Other Exam:     Impression & Recommendations:  Problem # 1:  PREVENTIVE HEALTH CARE (ICD-V70.0) Discussed nutrition,exercise,diet,healthy weight, vitamin D and calcium.    labs good  Problem # 2:  ROUTINE GYNECOLOGICAL EXAMINATION (ICD-V72.31)  Problem # 3:  ? of WHEEZING (ICD-786.07)  ? RAD   exposed to  chemical irritants. should have  c xray and pfts.    disc options  will do pulmonary referral.  Orders: Pulmonary Referral (Pulmonary)  Problem # 4:  ? of CHRONIC RHINITIS (ICD-472.0) ? if vm vs anatomic.. hx of nasal surgery .  add cortisone trial   and pulm referral   ent reeval if needed.   Problem # 5:  Hx of BRONCHIECTASIS LEFT LOWER LOBE (ICD-494.0) Assessment: Comment Only  Orders: Pulmonary Referral (Pulmonary)  Complete Medication List: 1)  Levothyroxine Sodium 75 Mcg Tabs (Levothyroxine sodium) .Marland Kitchen.. 1 by mouth once daily 2)  Nasacort Aq 55 Mcg/act Aers (Triamcinolone acetonide(nasal)) .... 2 sprays each nares  q d  Patient Instructions: 1)  will    do Pulmonary referral .. MOndays better . 2)  Use steroid nose spray in the meantime  for a least   2 weeks to assess response.  3)  You will be informed of pap  results when available.

## 2010-06-27 NOTE — Assessment & Plan Note (Signed)
Summary: FU  $50   STC   Vital Signs:  Patient profile:   45 year old female Menstrual status:  regular Height:      64 inches Weight:      172 pounds BMI:     29.63 Temp:     98.1 degrees F oral Pulse rate:   66 / minute BP sitting:   98 / 62  (left arm) Cuff size:   regular  Vitals Entered By: Bill Salinas CMA (Oct 16, 2008 4:26 PM) CC: follow-up visit, pt states synthroid working well but her energy level still isn't were she wants it to be/ ab   Primary Provider:  [name],MD  CC:  follow-up visit and pt states synthroid working well but her energy level still isn't were she wants it to be/ ab.  History of Present Illness: pt had i-131 rx for hyperthyroidism due to a multinodular goiter, 1 year ago.   she has 1-2 years of intermittent anxiety, but no associated palpitations in the chest.  she also has fatigue and headache, but no pallor.  no tremor of the hands.   Current Medications (verified): 1)  Synthroid 100 Mcg Tabs (Levothyroxine Sodium) .... Take 1 By Mouth Once Daily Daw  Allergies (verified): No Known Drug Allergies  Past History:  Past Medical History:    DEPRESSION (ICD-311)    HYPERTHYROIDISM (ICD-242.90) rs 1 131 2009    GOITER, MULTINODULAR (ICD-241.1)    DYSTHYMIA, SITUATIONAL (ICD-300.4)    JOINT CREPITUS, KNEE (ICD-719.66)    SYMPTOM, HEARTBURN (ICD-787.1)    PALPITATIONS, RECURRENT (ICD-785.1)    bronchiectasis lll from childhood pneumonia?  has bronch in about 1999.    childbirth    Consults     Dr. Everardo All    cunningham (07/25/2008)  Review of Systems       The patient complains of weight gain.    Physical Exam  General:  Well developed, well nourished, in no acute distress.  Neck:  no thyroid enlargement or tenderness. No cervical lymphadenopathy. Additional Exam:  tsh=0.22   Impression & Recommendations:  Problem # 1:  HYPOTHYROIDISM, POST-RADIATION (ICD-244.1) overreplaced  Problem # 2:  HEADACHE (ICD-784.0) uncertain  etiology  Medications Added to Medication List This Visit: 1)  Levothyroxine Sodium 75 Mcg Tabs (Levothyroxine sodium) .Marland Kitchen.. 1 qd  Other Orders: TLB-TSH (Thyroid Stimulating Hormone) (84443-TSH) T-Urine 24 Hr. Catecholamines [phone]) T-Urine 24 Hr. Metanephrines [phone]) Est. Patient Level III (46962)  Patient Instructions: 1)  reduce levothyroxine to 75 micrograms once daily 2)  check urine catecholamines 3)  i told pt that she is at risk for recurrence of her hyperthyroidism 4)  ret 4 mos Prescriptions: LEVOTHYROXINE SODIUM 75 MCG TABS (LEVOTHYROXINE SODIUM) 1 qd  #90 x 1   Entered and Authorized by:   Minus Breeding MD   Signed by:   Minus Breeding MD on 10/18/2008   Method used:   Electronically to        [facility]* (retail)       [address] [place], [place]  [address]       Ph: [phone]       Fax: [phone]   RxID:   [id number]

## 2010-06-27 NOTE — Assessment & Plan Note (Signed)
Summary: fu-$50-stc   Vital Signs:  Patient Profile:   45 Years Old Female Height:     63.25 inches Weight:      167.0 pounds Temp:     97.6 degrees F oral Pulse rate:   84 / minute BP sitting:   101 / 60  (right arm) Cuff size:   regular  Pt. in pain?   no  Vitals Entered By: Orlan Leavens (November 08, 2007 8:53 AM)                  Chief Complaint:  FOLLOW-UP.  History of Present Illness: pt states decreased appetite, but also decreased palpitations.  now about 6 weeks s/p i-131 rx.    Current Allergies: No known allergies   Past Medical History:    Reviewed history from 10/06/2007 and no changes required:       Unremarkable       Childbirth       under rx for hyperthyroid     Review of Systems       patient states she has lost about 4 pounds, due to her efforts   Physical Exam  General:     well developed, well nourished, in no acute distress Neck:     thyroid 3x normal size on the right, slightly enlarged on the left Additional Exam:     FREE T4                   1.6 ng/dL                   1.6-1.0 FastTSH              [L]  0.06 uIU/mL                 0.35-5.50     Impression & Recommendations:  Problem # 1:  HYPERTHYROIDISM (ICD-242.90) not better yet  Medications Added to Medication List This Visit: 1)  Wellbutrin Xl 150 Mg Tb24 (Bupropion hcl) .... Take 1 three times a day qd   Patient Instructions: 1)  ret 30d 2)  cc dr s Tomasa Rand   ]

## 2010-06-27 NOTE — Progress Notes (Signed)
Summary: Pt needs to have thyroid lvl checked per Dr. Lucianne Muss  Phone Note Call from Patient Call back at Home Phone [phone]   Caller: Patient Summary of Call: Pt called and said that Dr. Lucianne Muss is req to have blood work done for thyroid. Pt said that since Dr. Lucianne Muss isnt a [facility] doctor, that this would have to be ordered by Dr. Fabian Sharp.  Please advise.  Initial call taken by: Lucy Antigua,  November 16, 2009 9:45 AM  Follow-up for Phone Call        ok to  do tsh  what else does he need?   perhaps  have him fax a rx with dx code on this so we can get this done.  Follow-up by: Madelin Headings MD,  November 16, 2009 12:14 PM  Additional Follow-up for Phone Call Additional follow up Details #1::        Tried calling Dr. Emelda Brothers office, but their office was closed. I will try again Monday. Additional Follow-up by: Lucy Antigua,  November 16, 2009 4:47 PM    Additional Follow-up for Phone Call Additional follow up Details #2::    I called Dr. Emelda Brothers office and req that they send a rx and dx for the labs that Dr. Lucianne Muss wanted to be done. Dr. Emelda Brothers office are faxing the req labs and dx as noted.    Follow-up by: Lucy Antigua,  November 19, 2009 3:01 PM

## 2010-06-27 NOTE — Assessment & Plan Note (Signed)
Summary: discuss meds/$50/cd   Vital Signs:  Patient Profile:   45 Years Old Female Height:     63.25 inches Weight:      165.0 pounds O2 Sat:      95 % O2 treatment:    Room Air Temp:     98.0 degrees F oral Pulse rate:   67 / minute BP sitting:   102 / 72  (left arm) Cuff size:   regular  Pt. in pain?   no  Vitals Entered By: Orlan Leavens (June 05, 2008 3:36 PM)                  PCP:  Malena Edman  Chief Complaint:  DISCUSS MEDS.  History of Present Illness: pt c/o hair loss throughout her head. pt does not notice the goiter. takes synthroid 100/d as rx'ed. pt also c/o depression recently, in am.  it gets better after she takes her synthroid each day.    Prior Medications Reviewed Using: Patient Recall  Updated Prior Medication List: SYNTHROID 100 MCG TABS (LEVOTHYROXINE SODIUM) take 1 by mouth qd  Current Allergies: No known allergies   Past Medical History:    Reviewed history from 02/11/2008 and no changes required:       HEMORRHAGE OF RECTUM AND ANUS (ICD-569.3)       DEPRESSION (ICD-311)       HYPERTHYROIDISM (ICD-242.90) rs 1 131 2009       GOITER, MULTINODULAR (ICD-241.1)       DYSTHYMIA, SITUATIONAL (ICD-300.4)       JOINT CREPITUS, KNEE (ICD-719.66)       SYMPTOM, HEARTBURN (ICD-787.1)       PALPITATIONS, RECURRENT (ICD-785.1)       FAMILY HISTORY OF ANEURYSM AORTIC (ICD-V17.4)        bronchiectasis lll from childhood pneumonia?  has bronch in about 1999.       childbirth            Review of Systems  The patient denies weight loss and weight gain.         hs gained 5 lbs since last ov   Physical Exam  General:     healthy appearing.   Head:     i do not appreciate alopecia Neck:     thyroid slightly and diffusely enlarged, no nodule Additional Exam:     FastTSH                   0.97 uIU/mL    Impression & Recommendations:  Problem # 1:  GOITER, MULTINODULAR (ICD-241.1) improved with i-131 rx  Problem # 2:   HYPOTHYROIDISM, POST-RADIATION (ICD-244.1) well-controlled   Problem # 3:  alopecia not thyroid-related  Problem # 4:  DEPRESSION (ICD-311) not thyroid-related   Patient Instructions: 1)  same synthroid (100/d) 2)  ret 6 mos

## 2010-06-27 NOTE — Progress Notes (Signed)
Summary: wants allergra d  Phone Note Call from Patient Call back at Home Phone [phone]   Caller: Patient-live call Summary of Call: Having classic symtoms of allergies: runny nose, coughing, sneezing. Tried Claritin, Zyrtec. Not helping. She would like to get an updated rx for Allegra D called in to [facility]. Initial call taken by: Warnell Forester,  Sep 27, 2008 10:26 AM  Follow-up for Phone Call        ok to do  allergra d 24  1 by mouth once daily disp 30 refill x 3  Follow-up by: Madelin Headings MD,  Sep 27, 2008 2:50 PM    New/Updated Medications: ALLEGRA-D 24 HOUR 180-240 MG XR24H-TAB (FEXOFENADINE-PSEUDOEPHEDRINE) Take 1 tablet by mouth once a day   Prescriptions: ALLEGRA-D 24 HOUR 180-240 MG XR24H-TAB (FEXOFENADINE-PSEUDOEPHEDRINE) Take 1 tablet by mouth once a day  #30 x 3   Entered by:   Darra Lis RMA   Authorized by:   Madelin Headings MD   Signed by:   Darra Lis RMA on 09/28/2008   Method used:   Telephoned to ...       [facility]* (retail)       [address] [place], [place]  [address]       Ph: [phone]       Fax: [phone]   RxID:   [id number]   Appended Document: wants allergra d Patient aware.

## 2010-06-27 NOTE — Progress Notes (Signed)
Summary: report  Phone Note Call from Patient Call back at [phone]   Caller: vm Call For: Lori Cunningham Summary of Call: About to finish 2 antibiotics UTI. Finish Cipro today & Macrodantin tomttow.   Was to call report day before I finish if still problems.  Still having complications.   Back pain.  Stinging on sides where kidneys are & radiating into front abdomen under breast & hipbone area both sides.  Better but not well.  No temp.  All the same.  Sams.  NKDA. Initial call taken by: Rudy Jew, RN,  February 24, 2008 9:56 AM  Follow-up for Phone Call        continue cipeo a nother 5 days and call back Follow-up by: Madelin Headings MD,  February 24, 2008 12:23 PM  Additional Follow-up for Phone Call Additional follow up Details #1::        Med to [facility] & patient given info. Additional Follow-up by: Rudy Jew, RN,  February 24, 2008 2:17 PM    New/Updated Medications: CIPROFLOXACIN HCL 500 MG TABS (CIPROFLOXACIN HCL) 1 by mouth two times a day for 5 days   Prescriptions: CIPROFLOXACIN HCL 500 MG TABS (CIPROFLOXACIN HCL) 1 by mouth two times a day for 5 days  #10 x 0   Entered by:   Rudy Jew, RN   Authorized by:   Madelin Headings MD   Signed by:   Rudy Jew, RN on 02/24/2008   Method used:   Electronically to        [facility] (retail)       [address] [place], [place]  [address]       Ph: [phone]       Fax: [phone]   RxID:   [id number]

## 2010-06-27 NOTE — Progress Notes (Signed)
Summary: rai  Phone Note Call from Patient Call back at Home Phone [phone]   Caller: Patient/[phone] Call For: dr ellsion  Summary of Call: per pt call have already taken  the RAI  pill what does she do next , what the next step in plan  in  treatment Initial call taken by: Shelbie Proctor,  Sep 29, 2007 9:38 AM  Follow-up for Phone Call        next step is to give it time to work ret 6 weeks  Follow-up by: Minus Breeding MD,  Sep 29, 2007 10:57 AM  Additional Follow-up for Phone Call Additional follow up Details #1::        Sep 29, 2007 11:30 AM called pt to inform spoke with pt made aware  Additional Follow-up by: Shelbie Proctor,  Sep 29, 2007 11:30 AM

## 2010-06-27 NOTE — Progress Notes (Signed)
Summary: change allegra d 24 to generic allegra d 12  Phone Note From Pharmacy   Caller: [facility]* Summary of Call: Pt wants to change rx from Allegra d 24 to generic allegra d 12. Initial call taken by: Romualdo Bolk, CMA,  Oct 06, 2008 4:43 PM  Follow-up for Phone Call        [facility] per Dr. Fabian Sharp Sent rx via electronically Romualdo Bolk, CMA  Oct 06, 2008 4:45 PM     New/Updated Medications: FEXOFENADINE-PSEUDOEPHEDRINE 60-120 MG XR12H-TAB (FEXOFENADINE-PSEUDOEPHEDRINE) 1 by mouth q 12 hours as needed   Prescriptions: FEXOFENADINE-PSEUDOEPHEDRINE 60-120 MG XR12H-TAB (FEXOFENADINE-PSEUDOEPHEDRINE) 1 by mouth q 12 hours as needed  #30 x 3   Entered by:   Romualdo Bolk, CMA   Authorized by:   Madelin Headings MD   Signed by:   Romualdo Bolk, CMA on 10/06/2008   Method used:   Electronically to        [facility]* (retail)       [address] [place], [place]  [address]       Ph: [phone]       Fax: [phone]   RxID:   [id number]

## 2010-06-27 NOTE — Letter (Signed)
Summary: [facility]  [facility]   Imported By: Maryln Gottron 06/03/2010 15:05:58  _____________________________________________________________________  External Attachment:    Type:   Image     Comment:   External Document

## 2010-06-27 NOTE — Assessment & Plan Note (Signed)
Summary: pap smear/jls   Vital Signs:  Patient Profile:   45 Years Old Female Height:     63.25 inches Weight:      170 pounds Pulse rate:   66 / minute BP sitting:   100 / 60  (right arm) Cuff size:   regular  Vitals Entered By: Romualdo Bolk, CMA (Oct 06, 2007 3:11 PM)                 Chief Complaint:  gyne exam .  History of Present Illness: Lori Cunningham is here for a pap only. heavy then spotting    now regular q 28 days .  No breast symptom except pre menses sores . Had mammogram recently .   fissure still bothers her  ? hemorrhoild? fissure     bleeds before bm    for ever  tried ntg in past (moms but gave her a ha )  ? need colonoscopy.   also some HB at times    LMP: 09/28/07. Pt reviewed medication list and no changes made.  thyroid had iodine ablation    and to get tsh done soon.   NO palpitations      Prior Medications Reviewed Using: Patient Recall  Prior Medication List:  WELLBUTRIN XL 300 MG  TB24 (BUPROPION HCL) 1 by mouth once daily   Current Allergies (reviewed today): No known allergies   Past Medical History:    Unremarkable    Childbirth    under rx for hyperthyroid  Past Surgical History:    Denies surgical history    Carpal tunnel release right       Caesarean section  94 and 2000        Turbinate reduction deviated septum  shoemaker.    Family History:    Reviewed history from 07/30/2007 and no changes required:       mom borderline  dm  cholesterol       No known thyroid disease       Family History of Aneurysm Aorticpaternal GF              add hx mom had thyroid disease during time of pregnany loss.   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     Physical Exam  General:     alert, well-developed,  and well-nourished.   Neck:     goiter Lungs:     rare wheeze otherwise normal bs Heart:     Normal rate and regular rhythm. S1 and S2 normal without gallop, murmur, click, rub or other extra sounds. Abdomen:     Bowel sounds positive,abdomen soft and non-tender without masses, organomegaly or hernias noted. Rectal:     external hemorrhoid(s).  palpable nontender ? henorrhoid or fissue no blood and stool smear heme neg  Genitalia:     Pelvic Exam:        External: normal female genitalia without lesions or masses        Vagina: normal without lesions or masses        Cervix: normal without lesions or masses        Adnexa: normal bimanual exam without masses or fullness  Uterus: normal by palpation        Pap smear: performed Psych:     normally interactive and good eye contact.      Impression & Recommendations:  Problem # 1:  ROUTINE GYNECOLOGICAL EXAMINATION (ICD-V72.31) normal    Problem # 2:  HEMORRHAGE OF RECTUM AND ANUS (ICD-569.3) fissue vs hemorrhoid but ongoing        disc and agree with need for further eval     will do referral? need colonoscopy.  has some gerd symptom but otherwise no alrm symptom  to discuss with gi of appropriate to do egd  Orders: Gastroenterology Referral (GI)   Problem # 3:  GOITER, MULTINODULAR (ICD-241.1) Assessment: Comment Only  Complete Medication List: 1)  Wellbutrin Xl 300 Mg Tb24 (Bupropion hcl) .Marland Kitchen.. 1 by mouth once daily    ]

## 2010-06-27 NOTE — Progress Notes (Signed)
Summary: rx request  Phone Note Call from Patient Call back at Home Phone [phone]   Caller: Patient Summary of Call: pt called to ask if her rx for name brand synthroid be changed to either T3 T4 or armourthyroid. She would like rx to be sent to [facility] on [address] please advise Initial call taken by: Margaret Pyle, CMA,  February 01, 2009 11:55 AM  Follow-up for Phone Call        please avoid these products and businesses, as they are not good for your health Follow-up by: Minus Breeding MD,  February 01, 2009 12:37 PM  Additional Follow-up for Phone Call Additional follow up Details #1::        pt informed of MD's advise Additional Follow-up by: Margaret Pyle, CMA,  February 01, 2009 1:41 PM

## 2010-06-27 NOTE — Progress Notes (Signed)
  Phone Note Call from Patient Call back at Home Phone [phone]   Caller: [phone] Reason for Call: Lab or Test Results Summary of Call: per Carrolyn Leigh call need to know her lab results Initial call taken by: Shelbie Proctor,  June 06, 2008 10:51 AM  Follow-up for Phone Call        normal Follow-up by: Minus Breeding MD,  June 06, 2008 12:28 PM  Additional Follow-up for Phone Call Additional follow up Details #1::        called pt to inform pt made aware Additional Follow-up by: Shelbie Proctor,  June 06, 2008 1:25 PM

## 2010-06-27 NOTE — Progress Notes (Signed)
Summary: synthroid med  Phone Note Call from Patient Call back at Home Phone [phone]   Caller: Patient/[phone] Call For: Madelin Headings MD Summary of Call: per Carrolyn Leigh call fell she is not taking enough synthroid because she is having side efeects feeling depress in the morning hours then when she wakes up it goes away ...and c/o of hair loss. pt have an  apt with dr ellsion next monday  Initial call taken by: Shelbie Proctor,  May 29, 2008 9:11 AM  Follow-up for Phone Call        thanks, we'll address at ov Follow-up by: Minus Breeding MD,  May 29, 2008 12:56 PM      Appended Document: synthroid med informed pt made aware

## 2010-06-27 NOTE — Assessment & Plan Note (Signed)
Summary: consult for possible osa, sleep disruption   Copy to:  Berniece Andreas Primary Provider/Referring Provider:  Berniece Andreas, MD  CC:  Sleep Consult.  History of Present Illness: The pt is a 45y/o female who comes in today for evaluation of possible osa.  She sleeps alone, and it is unknown if she snores or not.  She has rare choking arousals during the night.  She goes to bed at 10pm, and arises at 6am to start her day.  She does not feel rested most am's upon arising.  What bother her the most are awakenings during the night with "heart racing and sweating", and she can awaken with a lot of anxiety as well.  Sometimes it is difficult for her to return to sleep once awakened.  She has tried sleeping medications, but would still awaken, and felt groggy the next day.  She admits that she has a lot of stress.  She has no h/o heart issues, but did have palpitations when she had hyperthyroidism.  She denies any environmental issue that may be causing her awakenings, and denies any nocturnal kicking or symptoms c/w RLS in the early evening.  She does not c/o of a lot of daytime sleepiness, and her epworth score today is only 3.  She does drink 3 large glasses of caffeinated tea each day, and admits to occasional napping during the day.  Her weight is neutral over the past 2 years.  Current Medications (verified): 1)  Armour Thyroid 60 Mg Tabs (Thyroid) .... Take One By Mouth Once Daily 2)  Docusate Sodium 100 Mg Caps (Docusate Sodium) .... Take Three By Mouth At Bedtime 3)  Prilosec 20 Mg Cpdr (Omeprazole) .... Take  One 30-60 Min Before First Meal of The Day 4)  Melatonin 5)  5h Herbal Supplement 6)  B Complex  Tabs (B Complex Vitamins)  Allergies (verified): No Known Drug Allergies  Past History:  Past Medical History: Reviewed history from 04/12/2009 and no changes required. DEPRESSION (ICD-311) HYPERTHYROIDISM (ICD-242.90) rs 1 131 2009 GOITER, MULTINODULAR (ICD-241.1) DYSTHYMIA,  SITUATIONAL (ICD-300.4) JOINT CREPITUS, KNEE (ICD-719.66) SYMPTOM, HEARTBURN (ICD-787.1) PALPITATIONS, RECURRENT (ICD-785.1) bronchiectasis lll from childhood pneumonia?  had  bronch in about 1999     - PFT's wnl 04/12/09 childbirth  Consults  Dr. [name]  Past Surgical History: Reviewed history from 03/13/2009 and no changes required. Carpal tunnel release bilateral    Sypher  Caesarean section  94 and 2000 Turbinate reduction deviated septum  shoemaker.  Bronchoscopy  in 1980s  bronchiesctesis  lll   Dr hopper Lucien Mons  Family History: Reviewed history from 07/16/2009 and no changes required. mom borderline  dm  cholesterol No known thyroid disease Family History of Aneurysm Aorticpaternal GF add hx mom had thyroid disease during time of pregnany loss.  No FH of Colon Cancer Fam Hx of CTS. no allergies Father sleepwalking   Social History: Reviewed history from 03/13/2009 and no changes required. Divorced  recently  , works in [employer].  is currently a Consulting civil engineer. 2 daughters Former smoker. Quit in 1985. Smoked 1/2 ppd x 2 yrs. Alcohol Use - yes-socially    HH of 3   Illicit Drug Use - no Patient does not get regular exercise.  Daily Caffeine Use  Review of Systems       The patient complains of irregular heartbeats, acid heartburn, and anxiety.  The patient denies shortness of breath with activity, shortness of breath at rest, productive cough, non-productive cough, coughing up blood,  chest pain, indigestion, loss of appetite, weight change, abdominal pain, difficulty swallowing, sore throat, tooth/dental problems, headaches, nasal congestion/difficulty breathing through nose, sneezing, itching, ear ache, depression, hand/feet swelling, joint stiffness or pain, rash, change in color of mucus, and fever.    Vital Signs:  Patient profile:   45 year old female Menstrual status:  regular Height:      64 inches Weight:      167.38 pounds BMI:      28.83 O2 Sat:      96 % on Room air Temp:     98.6 degrees F oral Pulse rate:   76 / minute BP sitting:   100 / 60  (left arm) Cuff size:   regular  Vitals Entered By: Arman Filter LPN (January 21, 2010 10:38 AM)  O2 Flow:  Room air CC: Sleep Consult Comments Medications reviewed with patient Arman Filter LPN  January 21, 2010 10:43 AM    Physical Exam  General:  wd female in nad Eyes:  PERRLA and EOMI.   Nose:  minimal septal deviation to left, but patent. Mouth:  mild elongation of soft palate and uvula Neck:  no jvd, tmg, LN Lungs:  clear to auscultation Heart:  rrr, no mrg Abdomen:  soft and nontender, bs+ Extremities:  no edema noted, no cyanosis  pulses intact distally Neurologic:  alert and oriented, moves all 4.   Impression & Recommendations:  Problem # 1:  PERSISTENT DISORDER INITIATING/MAINTAINING SLEEP (ICD-307.42) the pt is having significant sleep disruption at night that is complicated by prolonged awakening once it occurs.  It is unclear if this is a sleep disorder (osa, RLS, parasomnia,etc), an environmental issue that she isn't aware of, a physiological issue with her palpitations and diaphoresis when she awakens (i.e. arrhythmia), or possibly this is anxiety/panic.  She will need to have a sleep study if she wishes to address the issue, but I have also reviewed good sleep hygiene and behavioral techniques with her as well.  Medications Added to Medication List This Visit: 1)  Melatonin  2)  5h Herbal Supplement  3)  B Complex Tabs (B complex vitamins)  Other Orders: Consultation Level IV (29562) Sleep Disorder Referral (Sleep Disorder)  Patient Instructions: 1)  will schedule for sleep study, and arrange followup once results are available. 2)  do not stay in bed if you cannot re-initiate sleep after 3)  no caffeine after 10am 4)  try to exercise no closer than 3-4 hours before bedtime 5)  NO napping 6)  no computer usage after 10pm.

## 2010-06-27 NOTE — Progress Notes (Signed)
Summary: strep & blood ??  Phone Note Call from Patient Call back at [phone]   Caller: vm Call For: Lori Cunningham Summary of Call: 1) UA results.  Wed to [facility] for culture.  2) Daughter Lori Cunningham 11[birth year]09-92 reoccurring rash.  Out of cream.  Breasts covered with rash.  Itching.   Initial call taken by: Rudy Jew, RN,  March 22, 2008 9:45 AM  Follow-up for Phone Call        ok to refill x 2   also uc showed small amt of the grp b strep  Im not really sure its causing her problem she did have some blood in her urine.  .   ? consider see urologist.      we can restart  amoxicillin  in the meantime.  Refill Desonide for [facility] sent. Rudy Jew, RN  March 22, 2008 11:10 AM  Follow-up by: Madelin Headings MD,  March 22, 2008 10:34 AM  Additional Follow-up for Phone Call Additional follow up Details #1::        [name] was on her period, but tech told her it wouldn't make a difference.  Blood in urine because of period?  Wants to know if OK to live with the strep B in urine & wherever in her.  If necessarey referral to urologist in [facility] system only because they'll work with her on bill.  No insurance.  Symptoms better - occasional back pain.   Additional Follow-up by: Rudy Jew, RN,  March 22, 2008 10:54 AM    Additional Follow-up for Phone Call Additional follow up Details #2::    gb strep is usually more important in pregnant women and possibley diabetics. otherwise if no signs then would not worry about it.  Sorry about the conflicting   advice about  collecting  ua on a period .  Lets just follow if doing ok Follow-up by: Madelin Headings MD,  March 27, 2008 6:04 PM  Additional Follow-up for Phone Call Additional follow up Details #3:: Details for Additional Follow-up Action Taken: Patient aware and satisfied with that. Additional Follow-up by: Rudy Jew, RN,  March 28, 2008 9:09 AM

## 2010-06-27 NOTE — Progress Notes (Signed)
Summary: needs ov with kc to discuss sleep study results  Phone Note Outgoing Call   Call placed by: Carver Fila,  February 22, 2010 9:04 AM Call placed to: Patient Summary of Call: lmomtcb x1. pt needs ov with kc to discuss sleep study results.  Carver Fila  February 22, 2010 9:04 AM   Follow-up for Phone Call        Patient returned call and can be reached at [phone] Follow-up by: Leonette Monarch,  February 22, 2010 9:14 AM  Additional Follow-up for Phone Call Additional follow up Details #1::        pt is coming in on oct. 10 at 12:00 [facility]  February 22, 2010 10:51 AM

## 2010-06-27 NOTE — Progress Notes (Signed)
Summary: TRIAGE-? ABOUT MEDS. AND PREP.  Phone Note Call from Patient Call back at Work Phone [phone]   Caller: Patient Call For: DR Juanda Chance Reason for Call: Talk to Nurse Details for Reason: HAS QUESTIONS Summary of Call: HAS PROC ON MONDAY COL/ ENDO. DR [facility] ADVISED PATIENT TO CALL us. PATIENT HAS BACTERIAL INFECT x 1 MONTH- ON CIPRO.  ISI IT STILL OK TO HAVE? RECORDS IN SYSTEM. SYNTHOID AND WELLBUTRIN ALSO-PLEASE ADVISE ON HOW TO TAKE. Initial call taken by: Zackery Barefoot,  February 16, 2008 9:00 AM  Follow-up for Phone Call        Pt. Being treated for UTI, 2nd round, now on Cipro. She is scheduled for an Endo/Colon on Monday, wants to make sure it is O.K. to still have procedures. Pt. advised it is fine to keep appt. for procedures. All other questions about prep. and meds. answered. Pt. instructed to call back as needed.  Follow-up by: Laureen Ochs LPN,  February 16, 2008 10:03 AM

## 2010-06-27 NOTE — Progress Notes (Signed)
Summary: finished meds does she need and appt to follow up   Phone Note Call from Patient Call back at Work Phone [phone]   Caller: pt live Call For: Zaila Crew  Summary of Call: cipro finished today.  1 does she need appt to follow up does she need to come in and just do a culture.  Back pain was at 10 and is still at 3  Initial call taken by: Roselle Locus,  March 01, 2008 1:36 PM  Follow-up for Phone Call        Have pt come in for a UA only and then we'll go from there. Follow-up by: Romualdo Bolk, CMA,  March 01, 2008 1:45 PM  Additional Follow-up for Phone Call Additional follow up Details #1::        Set patient up for UA tomorrow  Roselle Locus  March 01, 2008 2:12 PM

## 2010-06-27 NOTE — Letter (Signed)
Summary: Note to MD regarding Biopsy results  Note to MD regarding Biopsy results   Imported By: Maryln Gottron 09/16/2007 15:44:29  _____________________________________________________________________  External Attachment:    Type:   Image     Comment:   External Document

## 2010-06-27 NOTE — Assessment & Plan Note (Signed)
Summary: UTI?/MHF   Vital Signs:  Patient Profile:   45 Years Old Female LMP:     07/13/2008 Height:     63.25 inches Weight:      165.8 pounds Temp:     97.7 degrees F oral Pulse rate:   78 / minute BP sitting:   120 / 80  (right arm) Cuff size:   regular  Vitals Entered By: Romualdo Bolk, CMA (July 25, 2008 3:36 PM)  Menstrual History: LMP (date): 07/13/2008 LMP - Character: normal Menarche: 9 Menses interval: 32 days Menstrual flow: 5 days                 PCP:  Malena Edman  Chief Complaint:  Frequency.  History of Present Illness: Lori Cunningham is here for SDA  frequency, urgency, and pressure. This started on 07/23/08. there is no  Fever    nausea or vomiting.    she still has some back pain at times whether there is a new back pain with burning tingling around the mid thorax to the abdomen bilaterally.  She also states that the palpitations that she had at the beginning of her thyroid dysfunction have come back to some degree.  She gets some leg weakness and sweats at times.  She does not really think it is from her anxiety.  She has been on Wellbutrin X300 from Dr. Tomasa Rand which he takes in the morning.  She questions whether she could have an adrenal tumor causing her symptoms because of information she heard on television.  Her last thyroid check was within normal limits. she is unsure if she is getting a brand  medication or alternating generics.      Prior Medications Reviewed Using: Patient Recall  Prior Medication List:  SYNTHROID 100 MCG TABS (LEVOTHYROXINE SODIUM) take 1 by mouth qd   Current Allergies (reviewed today): No known allergies   Past Medical History:        DEPRESSION (ICD-311)    HYPERTHYROIDISM (ICD-242.90) rs 1 131 2009    GOITER, MULTINODULAR (ICD-241.1)    DYSTHYMIA, SITUATIONAL (ICD-300.4)    JOINT CREPITUS, KNEE (ICD-719.66)    SYMPTOM, HEARTBURN (ICD-787.1)    PALPITATIONS, RECURRENT (ICD-785.1)  bronchiectasis lll from childhood pneumonia?  has bronch in about 1999.    childbirth        Consults     Dr. Virgel Bouquet   Family History:    mom borderline  dm  cholesterol    No known thyroid disease    Family History of Aneurysm Aorticpaternal GF    add hx mom had thyroid disease during time of pregnany loss.     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  The patient denies anorexia, fever, weight loss, hoarseness, syncope, prolonged cough, abdominal pain, melena, hematuria, unusual weight change, enlarged lymph nodes, and angioedema.     Physical Exam  General:     alert, well-developed, well-nourished, and well-hydrated.   Head:     normocephalic and atraumatic.   Eyes:     vision grossly intact, pupils equal, and pupils round.  Neck:     thyroid slightly and diffusely enlarged, no nodule Lungs:     Normal respiratory effort, chest expands symmetrically. Lungs are clear to auscultation, no crackles or wheezes.no dullness.   Heart:     Normal rate and regular rhythm. S1 and S2 normal without gallop, murmur, click, rub or other extra sounds. Abdomen:     Bowel sounds positive,abdomen soft and non-tender without masses, organomegaly or hernias noted. noflank pain Extremities:     no clubbing cyanosis or edema  Neurologic:     non focal  no tremor or defect. Skin:     turgor normal, color normal, no petechiae, and no purpura.   Cervical Nodes:     No lymphadenopathy noted Psych:     Oriented X3, normally interactive, and good eye contact.      Impression & Recommendations:  Problem # 1:  UTI (ICD-599.0) probable     empiric treatment and culture if needed Her updated medication list for this problem includes:    Cipro 500 Mg Tabs  (Ciprofloxacin hcl) .Marland Kitchen... 1 by mouth two times a day  Orders: Radiology Referral (Radiology)   Problem # 2:  BACK PAIN (ICD-724.5) left more than right  disc option  will get renal US   Orders: Radiology Referral (Radiology)   Problem # 3:  PALPITATIONS, RECURRENT (ICD-785.1) resarted   disc possibilities and low cange of having adrenal tumor but could consider discussing this with Dr Everardo All  if  WU is appropriate . in the mean time     check her meds  and  lifestyle intervention if cyclic with her periods .    Complete Medication List: 1)  Synthroid 100 Mcg Tabs (Levothyroxine sodium) .... Take 1 by mouth qd 2)  Wellbutrin Xl 300 Mg Xr24h-tab (Bupropion hcl) .Marland Kitchen.. 1 by mouth once daily 3)  Cipro 500 Mg Tabs (Ciprofloxacin hcl) .Marland Kitchen.. 1 by mouth two times a day   Patient Instructions: 1)  will call   about renal ultrasound.   2)  take antibiotic .  3)  check on brand generic of thyroid med and calendar signs    Prescriptions: CIPRO 500 MG TABS (CIPROFLOXACIN HCL) 1 by mouth two times a day  #10 x 0   Entered and Authorized by:   Madelin Headings MD   Signed by:   Madelin Headings MD on 07/25/2008   Method used:   Electronically to        [facility]* (retail)       [address] [place], [place]  [address]       Ph: [phone]       Fax: [phone]   RxID:   [id number]   Laboratory Results   Urine Tests  Date/Time Received: July 25, 2008 3:46 PM   Routine Urinalysis   Glucose: negative   (Normal Range: Negative) Bilirubin: negative   (Normal Range: Negative) Ketone: negative   (Normal Range: Negative) Spec. Gravity: 1.005   (Normal Range: 1.003-1.035) Blood: trace-lysed   (Normal Range: Negative) pH: 7.0   (Normal Range: 5.0-8.0) Protein: negative   (Normal Range: Negative) Urobilinogen: 0.2   (Normal Range: 0-1) Nitrite: negative   (Normal Range: Negative) Leukocyte Esterace: negative   (Normal Range: Negative)     Comments: Romualdo Bolk, CMA  July 25, 2008 3:46 PM

## 2010-06-27 NOTE — Assessment & Plan Note (Signed)
Summary: FU--$50---STC   Vital Signs:  Patient Profile:   45 Years Old Female Height:     63.25 inches Weight:      171.0 pounds Temp:     98.5 degrees F oral Pulse rate:   82 / minute BP sitting:   116 / 82  (left arm) Cuff size:   regular  Pt. in pain?   no  Vitals Entered By: Orlan Leavens (January 03, 2008 8:35 AM)                  PCP:  Malena Edman  Chief Complaint:  follow-up.  History of Present Illness: pt says "my heart is jumping."  not palpitations, but says "it is moving."  also says her heart is "fast."  also has slight tremor and anxiety.    Current Allergies: No known allergies   Past Medical History:    Reviewed history from 12/03/2007 and no changes required:       HEMORRHAGE OF RECTUM AND ANUS (ICD-569.3)       ROUTINE GYNECOLOGICAL EXAMINATION (ICD-V72.31)       DEPRESSION (ICD-311)       HYPERTHYROIDISM (ICD-242.90)       GOITER, MULTINODULAR (ICD-241.1)       DYSTHYMIA, SITUATIONAL (ICD-300.4)       JOINT CREPITUS, KNEE (ICD-719.66)       SYMPTOM, HEARTBURN (ICD-787.1)       PALPITATIONS, RECURRENT (ICD-785.1)       FAMILY HISTORY OF ANEURYSM AORTIC (ICD-V17.4)            Review of Systems  The patient denies fever.     Physical Exam  General:     well developed, well nourished, in no acute distress Neck:     multinodular goiter, no change Neurologic:     tremor Skin:     not diaphoretic Psych:     alert and cooperative; normal mood and affect; normal attention span and concentration Additional Exam:     FREE T4              [L]  0.4 ng/dL      2.9-5.6 FastTSH              [H]  7.08 uIU/mL          Impression & Recommendations:  Problem # 1:  GOITER, MULTINODULAR (ICD-241.1) Assessment: Unchanged  Problem # 2:  HYPOTHYROIDISM, POST-RADIATION (ICD-244.1) Assessment: New  Medications Added to Medication List This Visit: 1)  Synthroid 50 Mcg Tabs (Levothyroxine sodium) .... Qd  Other Orders: TLB-TSH (Thyroid  Stimulating Hormone) (84443-TSH) TLB-T4 (Thyrox), Free [phone])   Patient Instructions: 1)  synthroid 50/d 2)  ret 6 weeks   Prescriptions: SYNTHROID 50 MCG  TABS (LEVOTHYROXINE SODIUM) qd  #30 x 1   Entered and Authorized by:   Minus Breeding MD   Signed by:   Minus Breeding MD on 01/03/2008   Method used:   Electronically sent to ...       [facility]*       [address] [place], [place]  [address]       Ph: [phone]       Fax: [phone]   RxID:   [id number]  ]

## 2010-06-27 NOTE — Procedures (Signed)
Summary: EGD   EGD  Procedure date:  02/21/2008  Findings:      Location: [facility]    Patient Name: Lori Cunningham, Lori Cunningham MRN:  Procedure Procedures: Panendoscopy (EGD) CPT: 43235.    with biopsy(s)/brushing(s). CPT: D1846139.  Personnel: Endoscopist: Isaias Dowson L. Juanda Chance, MD.  Exam Location: Exam performed in Outpatient Clinic. Outpatient  Patient Consent: Procedure, Alternatives, Risks and Benefits discussed, consent obtained, from patient. Consent was obtained by the RN.  Indications Symptoms: Dysphagia. Reflux symptoms for 6-10 yrs,  History  Current Medications: Patient is not currently taking Coumadin.  Pre-Exam Physical: Performed Feb 21, 2008  Entire physical exam was normal.  Comments: Pt. history reviewed/updated, physical exam performed prior to initiation of sedation?yes Exam Exam Info: Maximum depth of insertion Duodenum, intended Duodenum. Vocal cords visualized. Gastric retroflexion performed. Images taken. ASA Classification: I. Tolerance: good.  Sedation Meds: Patient assessed and found to be appropriate for moderate (conscious) sedation. Fentanyl 50 mcg. given IV. Versed 7 mg. given IV. Cetacaine Spray 2 sprays given aerosolized.  Monitoring: BP and pulse monitoring done. Oximetry used. Supplemental O2 given  Findings - Normal: Distal Esophagus. Comments: small island of gastric mucosa at thr g-e junction, no stricture, no erosions.  - Dilation: Duodenal Bulb. Maloney dilator used, Diameter: 48 mm, Minimal Resistance, Minimal Heme present on extraction. 1  total dilators used. Patient tolerance excellent. Outcome: successful.  - Normal: Duodenal Bulb to Duodenal 2nd Portion.  Normal: Body.   Assessment Normal examination.  Comments: no structural abnormality Events  Unplanned Intervention: No unplanned interventions were required.  Unplanned Events: There were no complications. Plans Medication(s): Await pathology. H2Blocker:  Pepcid/Famotidine 40 mg QD, starting Feb 21, 2008   Patient Education: Patient given standard instructions for: Reflux.  Comments: colonoscopy Disposition: After procedure patient sent to recovery. After recovery patient sent home.    cc: Berniece Andreas, MD    REPORT OF SURGICAL PATHOLOGY   Case #: [id number] Patient Name: Lori Cunningham, Lori Cunningham. Office Chart Number:  [id number]   MRN: [id number] Pathologist: Alden Server A. Delila Spence, MD DOB/Age  April 03, 1966 (Age: 45)    Gender: F Date Taken:  02/21/2008 Date Received: 02/21/2008   FINAL DIAGNOSIS   ***MICROSCOPIC EXAMINATION AND DIAGNOSIS***   ESOPHAGOGASTRIC JUNCTION MUCOSA WITH MILD INFLAMMATION.  NO INTESTINAL METAPLASIA, DYSPLASIA OR MALIGNANCY IDENTIFIED (BIOPSY).   COMMENT An Alcian Blue stain is performed to determine the presence of intestinal metaplasia (goblet cell metaplasia). No intestinal metaplasia (goblet cell metaplasia) is identified with the Alcian Blue stain. The control stained appropriately. (EAA:jy) 02/22/08   jy Date Reported:  02/22/2008     Alden Server A. Delila Spence, MD *** Electronically Signed Out By EAA ***   Clinical information Anal fissure, constipation.  Heartburn.  R/O reflux esophagitis (kv)   specimen(s) obtained Gastroesophageal junction, biopsy   Gross Description Received in formalin are tan, soft tissue fragments that are submitted in toto.  Number: Two             Size:   0.3 and 0.4 cm (BM:mj 02/21/08)   mj/     Signed by Hart Carwin MD on 02/22/2008 at 10:11 PM  ________________________________________________________________________ ne recall EGD   Signed by Hart Carwin MD on 02/22/2008 at 10:11 PM  ________________________________________________________________________     February 22, 2008 MRN: [id number]    [facility] [address] [place], [place]  [address]    Dear Lori Cunningham,  I am pleased to inform you that the biopsies taken during your recent endoscopic  examination did not show any evidence of cancer upon pathologic examination.There is mild inflammation due to reflux  Additional information/recommendations:  _  _x_ Continue with the treatment plan as outlined on the day of your      exam.  _.   Please call us if you are having persistent problems or have questions about your condition that have not been fully answered at this time.  Sincerely,  Hart Carwin MD  This letter has been electronically signed by your physician.   Signed by Hart Carwin MD on 02/22/2008 at 10:12 PM  ________________________________________________________________________

## 2010-06-27 NOTE — Progress Notes (Signed)
Summary: synthroid  Phone Note From Pharmacy   Caller: [facility]* Summary of Call: recieved electronically Rx on Synthroid need to veriy quanity. Does Md want # 30 or 100. Initial call taken by: Orlan Leavens,  February 09, 2008 3:22 PM  Follow-up for Phone Call        resent Rx should have been # 100 Follow-up by: Orlan Leavens,  February 09, 2008 3:23 PM    New/Updated Medications: SYNTHROID 100 MCG TABS (LEVOTHYROXINE SODIUM) take 1 by mouth qd   Prescriptions: SYNTHROID 100 MCG TABS (LEVOTHYROXINE SODIUM) take 1 by mouth qd  #100 x 1   Entered by:   Orlan Leavens   Authorized by:   Minus Breeding MD   Signed by:   Orlan Leavens on 02/09/2008   Method used:   Electronically to        [facility]* (retail)       [address] [place], [place]  [address]       Ph: [phone]       Fax: [phone]   RxID:   [id number]

## 2010-06-27 NOTE — Assessment & Plan Note (Signed)
Summary: rov to discuss sleep study results.   Visit Type:  Follow-up Copy to:  Berniece Andreas Primary Provider/Referring Provider:  Berniece Andreas, MD  CC:  pt here to discuss sleep study results. .  History of Present Illness: the pt comes in today for f/u of her recent sleep study.  She was found to have an AHI less than one, no significant desaturations, and no significant leg jerks or abnormal behaviors.  She was found to have very frequent PVC's throughout the night.  I have gone over the study with her in detail, and answered all of her questions.  She has continued to have an element of insomnia after awakening, and I have reviewed with her again the various behavioral techniques that may help her.  Current Medications (verified): 1)  Armour Thyroid 60 Mg Tabs (Thyroid) .... Take One By Mouth Once Daily 2)  Docusate Sodium 100 Mg Caps (Docusate Sodium) .... Take Three By Mouth At Bedtime 3)  Prilosec 20 Mg Cpdr (Omeprazole) .... Take  One 30-60 Min Before First Meal of The Day 4)  Melatonin 5)  5h Herbal Supplement 6)  B Complex  Tabs (B Complex Vitamins) 7)  Triamcinolone Acetonide 0.1 % Crea (Triamcinolone Acetonide) .... Apply To Eczema  Two Times A Day 8)  Cyclobenzaprine Hcl 10 Mg Tabs (Cyclobenzaprine Hcl) .Marland Kitchen.. 1 By Mouth Three Times A Day As Needed Msucle Spasm 9)  Zoloft 50 Mg Tabs (Sertraline Hcl) .Marland Kitchen.. 1 By Mouth Once Daily Per [facility] 10)  Trazodone Hcl 50 Mg Tabs (Trazodone Hcl) .... Take At Night ? Dose  [facility]  Allergies (verified): No Known Drug Allergies  Review of Systems       The patient complains of anxiety.  The patient denies shortness of breath with activity, shortness of breath at rest, productive cough, non-productive cough, coughing up blood, chest pain, irregular heartbeats, acid heartburn, indigestion, loss of appetite, weight change, abdominal pain, difficulty swallowing, sore throat, tooth/dental problems, headaches, nasal congestion/difficulty  breathing through nose, sneezing, itching, ear ache, depression, hand/feet swelling, joint stiffness or pain, rash, change in color of mucus, and fever.    Vital Signs:  Patient profile:   45 year old female Menstrual status:  regular Height:      64 inches Weight:      173.13 pounds O2 Sat:      97 % on Room air Temp:     98.3 degrees F oral Pulse rate:   68 / minute BP sitting:   108 / 70  (left arm) Cuff size:   regular  Vitals Entered By: Carver Fila (March 04, 2010 12:03 PM)  O2 Flow:  Room air CC: pt here to discuss sleep study results.  Comments meds and allergies updated Phone number updated Carver Fila  March 04, 2010 12:03 PM    Physical Exam  General:  overweight female in nad Nose:  no purulence or discharge noted. Extremities:  no edema or cyanosis  Neurologic:  alert, does not appear sleepy, moves all 4.   Impression & Recommendations:  Problem # 1:  OTHER SLEEP DISTURBANCES (ICD-780.59) the pt had a normal sleep study except for very frequent pvc's.  Her initial complaint was frequent awakenings during the night associated with palpitations and sweating.  I think she needs to see cardiology, and will make the referral.    Problem # 2:  PERSISTENT DISORDER INITIATING/MAINTAINING SLEEP (ICD-307.42) I have reviewed with her again the various behavioral techniques to help with sleep re-initiation, and  am willing to give her a short treatment course with trazodone until she can get her arrhythmia issue sorted out.  I suspect she will do well once she quits having the awakenings during the night.  Medications Added to Medication List This Visit: 1)  Trazodone Hcl 50 Mg Tabs (Trazodone hcl) .... At bedtime  Other Orders: Est. Patient Level III (54098) Cardiology Referral (Cardiology)  Patient Instructions: 1)  will refer to cardiology for evaluation of your frequent PVC's on your sleep study. 2)  will give you a prescription for trazodone to use for the  next 3 weeks to help with sleep.  Remember the behavioral techniques we discussed as well.  Prescriptions: TRAZODONE HCL 50 MG  TABS (TRAZODONE HCL) At bedtime  #30 x 0   Entered and Authorized by:   Barbaraann Share MD   Signed by:   Barbaraann Share MD on 03/04/2010   Method used:   Print then Give to Patient   RxID:   [id number]    Immunization History:  Pneumovax Immunization History:    Pneumovax:  historical (02/11/2008)

## 2010-06-27 NOTE — Progress Notes (Signed)
Summary: pap smear report  Phone Note Call from Patient Call back at [phone]   Call For: panosh Summary of Call: Pap smear report.  Got call from [facility] yesterday & it's making her paranoid.   Initial call taken by: Rudy Jew, RN,  Oct 13, 2007 12:07 PM  Follow-up for Phone Call        Pt aware  of pap results. Follow-up by: Romualdo Bolk, CMA,  Oct 13, 2007 12:37 PM

## 2010-06-27 NOTE — Progress Notes (Signed)
Summary: Urine cuture.  Phone Note Call from Patient   Caller: Patient Call For: Dr Fabian Sharp Summary of Call: Pt would like Urine Culture results. [phone] Initial call taken by: Lynann Beaver CMA,  March 21, 2008 8:46 AM

## 2010-06-27 NOTE — Progress Notes (Signed)
Summary: UTI sx  Phone Note Call from Patient Call back at Work Phone [phone]   Caller: Patient Summary of Call: Pt is still having signs of bladder infection. PT has been on several antibiotics and nothing is helping. Pt is wondering if she needs to go to a specialist. Initial call taken by: Romualdo Bolk, CMA,  March 06, 2008 2:49 PM  Follow-up for Phone Call        Pt wants to come in and see someone. Follow-up by: Romualdo Bolk, CMA,  March 06, 2008 3:12 PM

## 2010-07-03 DIAGNOSIS — E042 Nontoxic multinodular goiter: Secondary | ICD-10-CM

## 2010-07-03 HISTORY — DX: Nontoxic multinodular goiter: E04.2

## 2010-07-23 NOTE — Progress Notes (Signed)
Summary: med- trazodone - pvc   PT TO DISCUSS TO Dereck Ligas MD   Phone Note Call from Patient Call back at Home Phone [phone]   Caller: Patient Reason for Call: Talk to Nurse Summary of Call: pt came to see dr. Antoine Poche for pvcs. pt on meds- TRAZODONE HCL 50 MG  TABS At bedtime. pt read the side effect from this meds. concerning about this may still cause her to have pvc. should she d/c this med pls advise.  Initial call taken by: Lorne Skeens,  June 25, 2010 12:25 PM  Follow-up for Phone Call        pt will need to discuss with MD who RXs medication.  She reports that it ( med change) has been discussed and that she doesn't want to change medications again unless the trazadone is "going to kill" her.  Instructed pt that Dr Antoine Poche knew she was taking it when he saw her and that if he felt like it was a problem - he would have instructed her then but I will discuss the issue with MD.  She is in agreement and aware I will call back to let her know his thoughts. Follow-up by: Charolotte Capuchin, RN,  June 25, 2010 12:45 PM  Additional Follow-up for Phone Call Additional follow up Details #1::        pt calling back to see what dr said re med-pls call [phone]  Glynda Jaeger  July 03, 2010 9:06 AM pt calling back wanting to know today what dr Dejane Scheibe has said pls cal [phone] Glynda Jaeger  July 05, 2010 10:10 AM pt waiting to take a med according to dr h's answer she doesn't want to not be taking it if she needs it but doesn't want to take it if it will "harm" her, she wants this info discussed with another dr here today and get a call back-[phone] Glynda Jaeger  July 11, 2010 1:43 PM    Additional Follow-up for Phone Call Additional follow up Details #2::    spoke with Weston Brass, pharmacist who states Trazadone is a safe medication and generally used only for sleep now.  If pt has stopped it and her PVC's are better then she should remain off of it.  If she is still  taking it she can continue until Dr Antoine Poche reviews as was discussed with the pt earlier.  Again - Dr Antoine Poche knew pt was taking medication when he saw her and would have asked her to d/c it if he felt PVC'c were coming from that.  left message for pt at number listed as there was NA.  Asked pt to call back if further concerns and that I will call her back next week once Dr Antoine Poche is back in the office (Thursday)and reviews.  Avie Arenas, RN  Additional Follow-up for Phone Call Additional follow up Details #3:: Details for Additional Follow-up Action Taken: This medication is not absolutely contrainidcated in this patient.  Risks which are low must be weighed against any benefit.  There have been cardiovascular system effects which have been reported include the following: conduction block, orthostatic hypotension and syncope, palpitations, bradycardia, atrial fibrillation, myocardial infarction, cardiac arrest, arrhythmia, and ventricular ectopic activity, including ventricular tachycardia.  She needs to discuss this with the prescribing physician.  LEFT MESSAGE OF THE ABOVE ON PT'S VM AND TO CALL BACK WITH ANY QUESTIONS  PAM FLEMING, RN Additional Follow-up by: Rollene Rotunda, MD, [facility],  July 14, 2010 6:51 PM

## 2010-07-28 ENCOUNTER — Inpatient Hospital Stay (INDEPENDENT_AMBULATORY_CARE_PROVIDER_SITE_OTHER)
Admission: RE | Admit: 2010-07-28 | Discharge: 2010-07-28 | Disposition: A | Discharge status: Home / Self Care | Payer: Self-pay | Source: Ambulatory Visit | Attending: Emergency Medicine | Admitting: Emergency Medicine

## 2010-07-28 DIAGNOSIS — R300 Dysuria: Secondary | ICD-10-CM

## 2010-07-28 LAB — POCT PREGNANCY, URINE: Preg Test, Ur: NEGATIVE

## 2010-07-28 LAB — POCT URINALYSIS DIPSTICK
Bilirubin Urine: NEGATIVE
Glucose, UA: NEGATIVE mg/dL
Hgb urine dipstick: NEGATIVE
Ketones, ur: NEGATIVE mg/dL
Nitrite: NEGATIVE
Protein, ur: NEGATIVE mg/dL
Specific Gravity, Urine: 1.01 (ref 1.005–1.030)
Urobilinogen, UA: 0.2 mg/dL (ref 0.0–1.0)
pH: 7 (ref 5.0–8.0)

## 2010-07-30 LAB — URINE CULTURE
Colony Count: NO GROWTH
Culture  Setup Time: 201203042128
Culture: NO GROWTH

## 2010-07-31 ENCOUNTER — Telehealth: Payer: Self-pay | Admitting: Internal Medicine

## 2010-07-31 NOTE — Telephone Encounter (Signed)
Per Dr. Fabian Sharp- it is possible she could have a chemical cystitis or interstitial cystitis. Avoid caffeine, alcohol, carbonation and spicy foods. Recommend we repeat ua with micro if abnormal at [facility]. May need urology to see if persistent. Left message on machine about this.

## 2010-07-31 NOTE — Telephone Encounter (Signed)
Triage vm---on Sunday, she went to [facility] urgent care for an UTI or a bladder infection. Was given 3 days of Cipro and Piridium. Was told to call here if not better. Still having frequency, discomfort and pressure. Please return call.        [employer].

## 2010-07-31 NOTE — Telephone Encounter (Signed)
Per Dr. Fabian Sharp- Spoke to pt to pt- LMP: 07/06/10, no fever or vaginal symptoms, urinary frequency, leaking urine, pressure, occ burning. Pyridium keeps it under control. Pt has had this for over 1 week before she saw the urgent care. No itching or vaginal discharge.

## 2010-08-01 ENCOUNTER — Other Ambulatory Visit: Payer: Self-pay

## 2010-08-01 ENCOUNTER — Other Ambulatory Visit: Payer: Self-pay | Admitting: Internal Medicine

## 2010-08-01 ENCOUNTER — Encounter (INDEPENDENT_AMBULATORY_CARE_PROVIDER_SITE_OTHER): Payer: Self-pay | Admitting: *Deleted

## 2010-08-01 DIAGNOSIS — N390 Urinary tract infection, site not specified: Secondary | ICD-10-CM

## 2010-08-01 LAB — URINALYSIS
Bilirubin Urine: NEGATIVE
Ketones, ur: NEGATIVE
Leukocytes, UA: NEGATIVE
Nitrite: NEGATIVE
Specific Gravity, Urine: 1.02 (ref 1.000–1.030)
Total Protein, Urine: NEGATIVE
Urine Glucose: NEGATIVE
Urobilinogen, UA: 0.2 (ref 0.0–1.0)
pH: 7 (ref 5.0–8.0)

## 2010-08-02 ENCOUNTER — Telehealth: Payer: Self-pay | Admitting: *Deleted

## 2010-08-02 DIAGNOSIS — R32 Unspecified urinary incontinence: Secondary | ICD-10-CM

## 2010-08-02 NOTE — Telephone Encounter (Signed)
Pt aware of results. She has an appt with them next week but would like to get in soonier. She is having urgency, pressure and leaking.

## 2010-08-02 NOTE — Telephone Encounter (Signed)
Message copied by Tor Netters on Fri Aug 02, 2010  8:11 AM ------      Message from: [facility] [place] [facility]      Created: Thu Aug 01, 2010  9:33 PM       Tell patient that urine is normal .      No evidence of UTI  Avoid bladder irritants such as  Carbonation etoh and caffiene and spicy foods   . If ongoing then would do a urology consults

## 2010-08-02 NOTE — Telephone Encounter (Signed)
Pt is feeling very uncomfortable in lower abdominal in the front. She is going to see the PA next week. But doesn't think she can wait until then to get something done.

## 2010-09-24 DIAGNOSIS — IMO0001 Reserved for inherently not codable concepts without codable children: Secondary | ICD-10-CM

## 2010-09-24 HISTORY — DX: Reserved for inherently not codable concepts without codable children: IMO0001

## 2010-10-08 NOTE — Op Note (Signed)
NAME:  [name], Lori Cunningham             ACCOUNT NO.:  [id number]   MEDICAL RECORD NO.:  [id number]          PATIENT TYPE:  AMB   LOCATION:  DSC                          FACILITY:  [facility]   PHYSICIAN:  Katy Fitch. Sypher, M.D. DATE OF BIRTH:  [birth year]   DATE OF PROCEDURE:  10/05/2008  DATE OF DISCHARGE:  10/05/2008                               OPERATIVE REPORT   PREOPERATIVE DIAGNOSIS:  Chronic entrapment neuropathy, left median  nerve, right carpal tunnel.   POSTOPERATIVE DIAGNOSIS:  Chronic entrapment neuropathy, left median  nerve, right carpal tunnel.   OPERATION:  Release of left transverse carpal ligament.   OPERATING SURGEON:  Katy Fitch. Sypher, MD   ASSISTANT:  Marveen Reeks Dasnoit, PA-C   ANESTHESIA:  General by LMA.   SUPERVISING ANESTHESIOLOGIST:  Quita Skye. Krista Blue, MD   INDICATIONS:  [name] is a 45 year old woman well-known to our  practice.  She is status post release of her right transverse carpal  ligament in 2003.  She is self-employed and has noted progressive  discomfort and numbness of left hand consistent with carpal tunnel  syndrome.  She returned for examination and had clinical findings  compatible with carpal tunnel syndrome.   She requested primary release of the transverse carpal ligament being  familiar with surgery.  After informed consent, she was brought to the  operating room at this time.   PROCEDURE:  Raguel Kosloski was brought to the operating room and placed  in supine position up on the operating table.   Following the induction of general anesthesia by LMA technique, left arm  was prepped with Betadine soap solution and sterilely draped.  A  pneumatic tourniquet was applied in proximal left brachium.   Following exsanguination of left arm with Esmarch bandage, the arterial  tourniquet was inflated to 220 mmHg.  Procedure commenced with short  incision in the line of the ring finger and the palm.  Subcutaneous  tissues were carefully  divided in the palmar fascia.  This split  longitudinally toward the common sensory branch of the nerve.  These  were followed back to the transverse carpal ligament, which was gently  isolated from median nerve.  The ligament was then released along its  ulnar border with scissors extending into the distal forearm.  This  widely opened carpal canal.  No mass or the predicaments were noted.  Bleeding points were not problematic.  The wound was then repaired with  intradermal 3-0 Prolene.  A compressive dressing was applied with a  volar plaster splint maintaining the wrist in 5 degrees dorsiflexion.   For aftercare, she is provided a prescription for Percocet 5 mg 1 p.o.  q.4-6h. p.r.n. pain, 20 tablets without refill.   We will see her back and follow up in approximately 7-8 days for suture  removal and advancement to an exercise program.      Katy Fitch. Sypher, M.D.  Electronically Signed     RVS/MEDQ  D:  10/05/2008  T:  10/05/2008  Job:  [id number]   cc:   Neta Mends. Fabian Sharp, MD

## 2010-10-08 NOTE — Assessment & Plan Note (Signed)
[facility]                            CARDIOLOGY OFFICE NOTE   NAME:Lori Cunningham, Lori Cunningham                    MRN:          [id number]  DATE:04/12/2007                            DOB:          Aug 27, 1965    PRIMARY:  Dr. Fabian Sharp   REASON FOR PRESENTATION:  Evaluate the patient with palpitations.   HISTORY OF PRESENT ILLNESS:  The patient is a pleasant 45 year old white  female with no past cardiac history.  She presents for evaluation of  palpitations.  There is also mention of an abnormal EKG, though I do not  have EKG from Dr. Fabian Sharp.  The patient does not describe skipping heart  beats.  Rather she feels her heart beating strongly.  This can happen at  any time.  She notices it at rest or with activity.  Again, it is not  fast or irregular.  There is no presyncope or syncope.  She just notices  that did not have this before.  Interestingly, it started around the  time she started Wellbutrin which has made a significant improvement in  depression.  She does not get any chest pressure, neck or arm  discomfort.  She does not have any shortness of breath and denies any  PND or orthopnea.  She drinks 1 cup of caffeine per day.  Sometimes she  does not even drink this and does not notice any difference.   PAST MEDICAL HISTORY:  1. She has no history of hypertension, diabetes, or hyperlipidemia.  2. There is a history of depression/anxiety.   PAST SURGICAL HISTORY:  1. Right carpal tunnel release.  2. Cesarean section x2.  3. Turbinate reduction for deviated septum.   ALLERGIES:  None.   MEDICATIONS:  Wellbutrin 300 mg daily.   SOCIAL HISTORY:  The patient has her own house cleaning business.  She  is divorced.  She has 2 daughters.  She was smoking a pack per day  briefly but quit in 1986.  She drinks alcohol socially.   FAMILY HISTORY:  Noncontributory for early coronary artery disease,  cardiomyopathy, sudden death, palpitations, or syncope.   REVIEW OF SYSTEMS:  As stated in the HPI and negative for other systems.   PHYSICAL EXAMINATION:  The patient is in no distress.  Blood pressure 125/71, heart rate 87 and regular, body mass index 28,  weight 167 pounds.  HEENT:  Eyes unremarkable; pupils equal, round, and react to light,  fundi not visualized, oral mucosa unremarkable.  NECK:  No jugular venous distention, waveform within normal limits,  carotid upstroke brisk and symmetric, no bruits, positive mild  thyromegaly without nodules.  LYMPHATICS:  No cervical, axillary, or inguinal adenopathy.  LUNGS:  Clear to auscultation bilaterally.  BACK:  No costovertebral angle tenderness.  CHEST:  Unremarkable.  HEART:  PMI not displaced or sustained.  S1 and S2 within normal limits.  No S3, no S4.  No clicks, no rubs, no murmurs.  ABDOMEN:  Flat, positive bowel sounds normal in frequency and pitch.  No  bruits, no rebound, no guarding, no midline pulsatile mass,  hepatomegaly, or splenomegaly.  SKIN:  No rashes, no nodules.  EXTREMITIES:  2+ pulses throughout, no edema, no cyanosis, no clubbing.  NEURO:  Oriented to person, place, and time.  Cranial nerves 2-12  grossly intact.  Motor grossly intact throughout.   EKG:  Sinus rhythm, rate 71, axis within normal limits, intervals within  normal limits, no acute ST-T wave changes.   ASSESSMENT AND PLAN:  1. Palpitations.  The patient actually had some ectopy while I was      listening to her.  This was not the sensation that she was      describing.  She was not particularly bothered by this ectopy.      Rather she is describing a strong but regular heartbeat.  It is      possible that this could be related to the Wellbutrin.  However,      she thinks this drug has made such a profound difference, she would      not want to think about stopping this.  I am going to give her      metoprolol 25 mg a day to see if this makes any difference in this      sensation which is somewhat  troubling to her.  She knows that she      can increase to 50 if she has no side effects but no improvement.      If this dose does not work, then I would not suspect a higher dose      would make a difference.  She can wean herself off of it, and I      gave her instructions about this.  2. Followup.  I would not see the patient back if she is not      particularly bothered by these palpitations going forward or if the      beta blocker seems to work.  However, if she has any increasing      symptoms, she agrees to come back and see me.  3. Thyromegaly.  The patient does have a mildly enlarged thyroid.  She      apparently had some mildly abnormal thyroid studies, though I do      not have these.  She is following with Dr. Fabian Sharp for this.  This      could explain symptoms as well depending on the degree of thyroid      abnormality.  We discussed this.     Rollene Rotunda, MD, [facility]  Electronically Signed    JH/MedQ  DD: 04/12/2007  DT: 04/12/2007  Job #: [id number]   cc:   Neta Mends. Fabian Sharp, MD

## 2010-10-17 ENCOUNTER — Telehealth: Payer: Self-pay | Admitting: Internal Medicine

## 2010-10-17 NOTE — Telephone Encounter (Signed)
Pt has had body aches for 3 weeks now and is req a work in ov with Dr Fabian Sharp tomorrow afternoon, after 1pm. Pls advise.

## 2010-10-18 ENCOUNTER — Encounter: Payer: Self-pay | Admitting: Internal Medicine

## 2010-10-18 ENCOUNTER — Ambulatory Visit (INDEPENDENT_AMBULATORY_CARE_PROVIDER_SITE_OTHER): Payer: Self-pay | Admitting: Internal Medicine

## 2010-10-18 ENCOUNTER — Other Ambulatory Visit: Payer: Self-pay | Admitting: Internal Medicine

## 2010-10-18 VITALS — BP 98/62 | HR 97 | Temp 98.4°F | Wt 177.0 lb

## 2010-10-18 DIAGNOSIS — R635 Abnormal weight gain: Secondary | ICD-10-CM

## 2010-10-18 DIAGNOSIS — M791 Myalgia, unspecified site: Secondary | ICD-10-CM

## 2010-10-18 DIAGNOSIS — G4700 Insomnia, unspecified: Secondary | ICD-10-CM

## 2010-10-18 DIAGNOSIS — IMO0001 Reserved for inherently not codable concepts without codable children: Secondary | ICD-10-CM

## 2010-10-18 DIAGNOSIS — E890 Postprocedural hypothyroidism: Secondary | ICD-10-CM

## 2010-10-18 LAB — CBC WITH DIFFERENTIAL/PLATELET
Basophils Absolute: 0 10*3/uL (ref 0.0–0.1)
Basophils Relative: 0 % (ref 0–1)
Eosinophils Absolute: 0.2 10*3/uL (ref 0.0–0.7)
Eosinophils Relative: 3 % (ref 0–5)
HCT: 38.9 % (ref 36.0–46.0)
Hemoglobin: 12.9 g/dL (ref 12.0–15.0)
Lymphocytes Relative: 33 % (ref 12–46)
Lymphs Abs: 2.1 10*3/uL (ref 0.7–4.0)
MCH: 29.6 pg (ref 26.0–34.0)
MCHC: 33.2 g/dL (ref 30.0–36.0)
MCV: 89.2 fL (ref 78.0–100.0)
Monocytes Absolute: 0.5 10*3/uL (ref 0.1–1.0)
Monocytes Relative: 8 % (ref 3–12)
Neutro Abs: 3.5 10*3/uL (ref 1.7–7.7)
Neutrophils Relative %: 56 % (ref 43–77)
Platelets: 326 10*3/uL (ref 150–400)
RBC: 4.36 MIL/uL (ref 3.87–5.11)
RDW: 13 % (ref 11.5–15.5)
WBC: 6.4 10*3/uL (ref 4.0–10.5)

## 2010-10-18 LAB — TSH: TSH: 7.208 u[IU]/mL — ABNORMAL HIGH (ref 0.350–4.500)

## 2010-10-18 LAB — HEPATIC FUNCTION PANEL
ALT: 21 U/L (ref 0–35)
AST: 21 U/L (ref 0–37)
Albumin: 3.9 g/dL (ref 3.5–5.2)
Alkaline Phosphatase: 81 U/L (ref 39–117)
Bilirubin, Direct: 0.1 mg/dL (ref 0.0–0.3)
Indirect Bilirubin: 0.1 mg/dL (ref 0.0–0.9)
Total Bilirubin: 0.2 mg/dL — ABNORMAL LOW (ref 0.3–1.2)
Total Protein: 6.4 g/dL (ref 6.0–8.3)

## 2010-10-18 LAB — CK: Total CK: 64 U/L (ref 7–177)

## 2010-10-18 NOTE — Progress Notes (Signed)
Subjective:    Patient ID: Lori Cunningham, female    DOB: 08-27-1965, 45 y.o.   MRN: [id number]  HPI Patient comes in for an acute visit today because of a problem over the last 4 weeks which she describes as a flulike feeling. No associated fever but her arms and her bottom of her feet hurt every morning. Sometimes her shoulders catch but it is mostly her distal arms with deep achiness and no burning. Sometimes it is hard to lift her arms in the morning. She has had bilateral carpal tunnel surgery and this does not feel related to that. She feels some somewhat swollen but hasn't really changed her diet. No fever chest pain shortness of breath she has some minor palpitations. Her sleep is much better said she has been on low-dose trazodone and Zoloft 100 mg. She feels her mood is good she is not depressed or anxious.   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 She is hiring someone to help her with her work but she doesn't think that is the main problem.   Her thyroid medication is now a branded Synthroid 88 her last test was months ago unsure when she is to recheck this.  Review of Systems Negative for chest pain shortness of breath vision hearing sweats swollen glands she has had some weight gain no sleep apnea falling bruising bleeding. She would like a referral for nutritional evaluation she's had a palpitation evaluation in the past thyroid disease treated Dr. Antoine Poche feels otherwise no need for further evaluation. She has had some neck pain off and on and got a different pill but no numbness down her arms.  Past history family history social history reviewed in the previous electronic medical record.     Objective:   Physical Exam  Physical Exam: Vital signs reviewed WUJ:WJXB is a well-developed well-nourished alert cooperative  white female who appears her stated age in no acute distress.  HEENT: normocephalic  traumatic , Eyes:  PERRL EOM's full, conjunctiva clear, Nares: paten,t no deformity discharge or tenderness., Ears: no deformity EAC's clear TMs with normal landmarks. Mouth: clear OP, no lesions, edema.  Moist mucous membranes. Dentition in adequate repair. NECK: supple without masses,  or bruits. CHEST/PULM:  Clear to auscultation and percussion breath sounds equal no wheeze , rales or rhonchi. No chest wall deformities or tenderness. CV: PMI is nondisplaced, S1 S2 no gallops, murmurs, rubs. Peripheral pulses are full without delay.No JVD .  ABDOMEN: Bowel sounds normal nontender  No guard or rebound, no hepato splenomegal no CVA tenderness.  No  Extremtities:  No clubbing cyanosis or edema, no acute joint swelling or redness no focal atrophy somewhat stiff but normal gait no focal atrophy points to her distal forearm as area of pain but no tendinitis or crepitus. NEURO:  Oriented x3, cranial nerves 3-12 appear to be intact, no obvious focal weakness,gait within normal limits no abnormal reflexes or asymmetrical SKIN: No acute rashes normal turgor, color, no bruising or petechiae. PSYCH: Oriented, good eye contact, no obvious depression anxiety, cognition and judgment appear normal.  Chart reviewed last thyroid test December 2011  Assessment & Plan:  Extremity pain flu like feeling possible myalgias Patient reports flulike  feeling but no fever and chills.  She is taking naproxen and ibuprofen so will check LFTs to make sure it is not a matter of fact. We'll also recheck her thyroid levels and inflammatory markers. I am not sure  she has proximal weakness but she reports proximal stiffness and distal pain.  I suppose the upper extremity symptoms could be cervical related but not classic at this time.  Hypothyroid status post ablation  recheck level Palpitations better stable Reactive mood stable on medication. Sleep is good. Weight gain patient concerned about this would like to talk to a nutritionist. Will  refer.

## 2010-10-18 NOTE — Patient Instructions (Signed)
Labs today to check your thyroid blood count and muscle enzymes. Report to you r the results and then plan followup. I am not sure why you have this but if it is viral it should get better in the next few weeks

## 2010-10-18 NOTE — Telephone Encounter (Signed)
Pt coming in at 3:15pm today per Dr Fabian Sharp.

## 2010-10-19 LAB — SEDIMENTATION RATE: Sed Rate: 5 mm/hr (ref 0–22)

## 2010-10-22 LAB — ANA: Anti Nuclear Antibody(ANA): POSITIVE — AB

## 2010-10-22 LAB — ANTI-NUCLEAR AB-TITER (ANA TITER): ANA Titer 1: 1:80 {titer} — ABNORMAL HIGH

## 2010-10-23 ENCOUNTER — Telehealth: Payer: Self-pay | Admitting: *Deleted

## 2010-10-23 NOTE — Telephone Encounter (Signed)
See Lab result note.  

## 2010-10-23 NOTE — Telephone Encounter (Signed)
Pt wants lab results 

## 2010-10-24 ENCOUNTER — Telehealth: Payer: Self-pay | Admitting: *Deleted

## 2010-10-24 NOTE — Telephone Encounter (Signed)
Message copied by Romualdo Bolk on Thu Oct 24, 2010 11:20 AM ------      Message from: [facility], [place] [facility]      Created: Wed Oct 23, 2010  5:25 PM       Tell patient that her thyroid shows she needs a higher dose of medication.      The rest of her labs were normal except for she had a borderline ANA which is a screening test for autoimmune disease.  This may not be significant. However, I would increase her Synthroid and get her thyroid controlled and if she does not feel better to consider seeing a rheumatologist.  I do not know if there is one in the [facility] system.            We can send copy of her TSH to her endocrinologist to get dosing advice.

## 2010-10-24 NOTE — Telephone Encounter (Signed)
Tried to call pt but wasn't able to get thru. Phone states that all circuits are busy. I will try again later.

## 2010-10-24 NOTE — Telephone Encounter (Signed)
Pt aware of results 

## 2010-10-24 NOTE — Telephone Encounter (Signed)
Pt aware of results. Pt see's Dr. Lucianne Muss. Results faxed to Dr. Remus Blake office.

## 2010-10-25 LAB — CK: Total CK: 67 U/L (ref 7–177)

## 2010-11-05 ENCOUNTER — Encounter: Payer: Self-pay | Admitting: Internal Medicine

## 2010-11-05 DIAGNOSIS — J479 Bronchiectasis, uncomplicated: Secondary | ICD-10-CM | POA: Insufficient documentation

## 2010-11-19 ENCOUNTER — Ambulatory Visit: Payer: Self-pay | Admitting: *Deleted

## 2010-12-09 ENCOUNTER — Ambulatory Visit: Payer: Self-pay | Admitting: *Deleted

## 2010-12-13 ENCOUNTER — Other Ambulatory Visit: Payer: Self-pay | Admitting: Internal Medicine

## 2010-12-13 DIAGNOSIS — Z1231 Encounter for screening mammogram for malignant neoplasm of breast: Secondary | ICD-10-CM

## 2010-12-20 ENCOUNTER — Encounter: Payer: Self-pay | Admitting: Internal Medicine

## 2010-12-20 ENCOUNTER — Ambulatory Visit (INDEPENDENT_AMBULATORY_CARE_PROVIDER_SITE_OTHER): Payer: Self-pay | Admitting: Internal Medicine

## 2010-12-20 VITALS — BP 118/80 | HR 88 | Temp 98.6°F | Wt 182.0 lb

## 2010-12-20 DIAGNOSIS — N390 Urinary tract infection, site not specified: Secondary | ICD-10-CM

## 2010-12-20 DIAGNOSIS — M791 Myalgia, unspecified site: Secondary | ICD-10-CM

## 2010-12-20 DIAGNOSIS — E890 Postprocedural hypothyroidism: Secondary | ICD-10-CM

## 2010-12-20 DIAGNOSIS — R635 Abnormal weight gain: Secondary | ICD-10-CM

## 2010-12-20 DIAGNOSIS — IMO0001 Reserved for inherently not codable concepts without codable children: Secondary | ICD-10-CM

## 2010-12-20 LAB — TSH: TSH: 0.97 u[IU]/mL (ref 0.35–5.50)

## 2010-12-20 NOTE — Patient Instructions (Signed)
Will notify you  of labs when available. Will send results to [facility]. Agree see nutritionist Limit eating out. Increase exercise on non work days.

## 2010-12-20 NOTE — Progress Notes (Signed)
  Subjective:    Patient ID: Lori Cunningham, female    DOB: Dec 02, 1965, 45 y.o.   MRN: [id number]  HPI Comes in for fu of labs and sx .   Still tired but myalgias are better on increase dose of Synthroid now on 100 brand. Due for labs  And to send to [facility].  Concern though about weight gain.  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   Sleep ok and mood is good on zoloft 100 mg  . Has been on this for about a year.   Has ? About interventions.    Going to dietitian. Eats out 5  Meals per week.   Doesn't think she eats much  Chick filet etc.    Review of Systems No cp sob sleeping well no new rashes  Joint swelling redness Past history family history social history reviewed in the electronic medical record.     Objective:   Physical Exam WDWN in NAD  Looks well  Normal affect   Oriented x 3 and no noted deficits in, attention, and speech.  Labs reviewed with patient. ANA 1;80  homogenous      Assessment & Plan:  Hypothyroid  Replacement  change  And  Myalgia is better .  On at least 4 weeks of new dose.    Check tsh today and send results  Weight gain still an issue gained 5 #   To see nutritionist soon. Counseled.   Expectant management.   Limit eating out and eval intake. Mood is good: Consider   Decreasing dose when     THYROID DOSE IS stable.   ANA  don't think this is significant at this point in time   Not associated with weight gain   Myalgia is better .   Total visit > 50% spent counseling and coordinating care

## 2010-12-23 ENCOUNTER — Encounter: Payer: Self-pay | Admitting: *Deleted

## 2010-12-23 ENCOUNTER — Encounter: Payer: Self-pay | Attending: Internal Medicine | Admitting: *Deleted

## 2010-12-23 DIAGNOSIS — Z713 Dietary counseling and surveillance: Secondary | ICD-10-CM | POA: Insufficient documentation

## 2010-12-23 DIAGNOSIS — E890 Postprocedural hypothyroidism: Secondary | ICD-10-CM | POA: Insufficient documentation

## 2010-12-23 DIAGNOSIS — R635 Abnormal weight gain: Secondary | ICD-10-CM | POA: Insufficient documentation

## 2010-12-23 NOTE — Patient Instructions (Addendum)
Goals:  Eat 3 meals/day; Avoid meal skipping   Increase protein and fiber rich foods  Choose more whole grains, lean protein, low-fat dairy, and fruits/non-starchy vegetables.   Aim for >30 min of physical activity daily.  Aim for 25 g fiber daily.  Limit sugar-sweetened beverages and concentrated sweets.  Continue to keep food journal and bring to next visit.   Increase fluids to 64 oz daily.

## 2010-12-23 NOTE — Progress Notes (Signed)
  Medical Nutrition Therapy:  Appt start time: 0800 end time:  0900.  Assessment:  Primary concerns today: Weight Managment. Pt c/o ~20 lb weight gain over last 3 years as hyper/hypothyroid issues worked out.  UBW of ~160 lb and pt very upset about weight gain.  Pt in graduate school and a single mom of 2 teenage daughters, so time limited. 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  Reports past meal skipping, but currently eats 3 meals with 0 snacks daily and eats "whatever is the easiest at the time".  Unable to obtain food recall d/t time constraints. Will attempt at follow up visit.  MEDICATIONS: Colace, bisacodyl, synthroid, zoloft   Usual physical activity: NONE  Estimated energy needs: 1100-1200 calories 135-150 g carbohydrates 55-60 g protein 35-40 g fat  Progress Towards Goal(s):  NEW.   Nutritional Diagnosis:  [place]-3.4 Unintentional weight gain related to abnormal thyroid as evidenced by pt-reported weight gain of 20 lbs over 3 years after starting medicine and current hypothyroidism.    Intervention/Goals:  Eat 3 meals/day; Avoid meal skipping   Increase protein and fiber rich foods  Choose more whole grains, lean protein, low-fat dairy, and fruits/non-starchy vegetables.   Aim for >30 min of physical activity daily.  Aim for 25 g fiber daily.  Limit sugar-sweetened beverages and concentrated sweets.  Continue to keep food journal and bring to next visit.   Increase fluids to 64 oz daily.  Monitoring/Evaluation:  Dietary intake, exercise, and body weight in 2 weeks (pt requested).

## 2011-01-06 ENCOUNTER — Encounter: Payer: Self-pay | Attending: Internal Medicine | Admitting: *Deleted

## 2011-01-06 ENCOUNTER — Encounter: Payer: Self-pay | Admitting: *Deleted

## 2011-01-06 DIAGNOSIS — E890 Postprocedural hypothyroidism: Secondary | ICD-10-CM | POA: Insufficient documentation

## 2011-01-06 DIAGNOSIS — Z713 Dietary counseling and surveillance: Secondary | ICD-10-CM | POA: Insufficient documentation

## 2011-01-06 DIAGNOSIS — R635 Abnormal weight gain: Secondary | ICD-10-CM | POA: Insufficient documentation

## 2011-01-06 NOTE — Patient Instructions (Addendum)
Goals:  Avoid meal skipping - Consume 3 meals plus snacks daily or every 3-5 hours.  Choose more whole grains and fruits with higher fiber content.  Only track carbs (for now) - aim to meet 45 grams at meals and 15 grams at snacks - Add lean protein to all meals and snacks.  Limit sweets and sugar-sweetened beverages.  Aim for 64 oz of fluid daily.

## 2011-01-06 NOTE — Progress Notes (Signed)
  Medical Nutrition Therapy:  Appt start time: 0830  End time: 0900.  Assessment:  Primary concerns today: Weight Mgmt Follow-up.   Pt reports 7 lb weight loss after last visit, but states watching her dietary intake became a "psychological issue" and she also did not feel full/satisfied - pt then d/c'd plan. States when she used to eat a chicken biscuit from [employer] for breakfast, she felt satisfied all day and wouldn't need to eat again until dinner.  Pt requests a "schedule" of meals with foods she can eat so she does not have to stress over reading food labels, etc. Gave pt 45 gram CHO meal plan handout.    Food recall reveals decreased intake, though meal skipping has ceased.  Pt states her thyroid med is stable now.  No structured exercise noted.   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   Nutritional Diagnosis:  [place]-3.4 Unintentional weight gain related to abnormal thyroid as evidenced by pt-reported weight gain of 20 lbs over 3 years after starting medicine and diagnosis of hypothyroidism.    Intervention/Goals:  Avoid meal skipping - Consume 3 meals plus snacks daily or every 3-5 hours.  Choose more whole grains and fruits with higher fiber content.  Only track carbs (for now) - aim to meet 45 grams at meals and 15 grams at snacks - Add lean protein to all meals and snacks.  Limit sweets and sugar-sweetened beverages.  Aim for 64 oz of fluid daily.   Monitoring/Evaluation:  Dietary intake, exercise, and body weight in 2 week(s).

## 2011-01-20 ENCOUNTER — Ambulatory Visit: Payer: Self-pay | Admitting: *Deleted

## 2011-02-10 ENCOUNTER — Ambulatory Visit
Admission: RE | Admit: 2011-02-10 | Discharge: 2011-02-10 | Disposition: A | Discharge status: Home / Self Care | Payer: Self-pay | Source: Ambulatory Visit | Attending: Internal Medicine | Admitting: Internal Medicine

## 2011-02-10 DIAGNOSIS — Z1231 Encounter for screening mammogram for malignant neoplasm of breast: Secondary | ICD-10-CM

## 2011-03-06 LAB — POCT RAPID STREP A: Streptococcus, Group A Screen (Direct): NEGATIVE

## 2011-03-20 ENCOUNTER — Telehealth: Payer: Self-pay | Admitting: *Deleted

## 2011-03-20 DIAGNOSIS — E890 Postprocedural hypothyroidism: Secondary | ICD-10-CM

## 2011-03-20 NOTE — Telephone Encounter (Signed)
Pt needs to have a order in epic for tsh, free t3 and free t4 sent to [facility]. Per Dr. Fabian Sharp ok to do. Pt aware.

## 2011-03-21 ENCOUNTER — Other Ambulatory Visit (INDEPENDENT_AMBULATORY_CARE_PROVIDER_SITE_OTHER): Payer: Self-pay

## 2011-03-21 DIAGNOSIS — E890 Postprocedural hypothyroidism: Secondary | ICD-10-CM

## 2011-03-21 LAB — TSH: TSH: 0.97 u[IU]/mL (ref 0.35–5.50)

## 2011-03-21 LAB — T4, FREE: Free T4: 0.66 ng/dL (ref 0.60–1.60)

## 2011-03-21 LAB — T3, FREE: T3, Free: 3 pg/mL (ref 2.3–4.2)

## 2011-03-27 NOTE — Progress Notes (Signed)
Pt aware and will pick up a copy to take to her specialist.

## 2011-05-07 ENCOUNTER — Ambulatory Visit (INDEPENDENT_AMBULATORY_CARE_PROVIDER_SITE_OTHER): Payer: Self-pay | Admitting: Internal Medicine

## 2011-05-07 ENCOUNTER — Encounter: Payer: Self-pay | Admitting: Internal Medicine

## 2011-05-07 VITALS — BP 120/80 | HR 72 | Ht 63.5 in | Wt 174.0 lb

## 2011-05-07 DIAGNOSIS — M791 Myalgia, unspecified site: Secondary | ICD-10-CM

## 2011-05-07 DIAGNOSIS — R002 Palpitations: Secondary | ICD-10-CM

## 2011-05-07 DIAGNOSIS — M722 Plantar fascial fibromatosis: Secondary | ICD-10-CM | POA: Insufficient documentation

## 2011-05-07 DIAGNOSIS — IMO0001 Reserved for inherently not codable concepts without codable children: Secondary | ICD-10-CM

## 2011-05-07 DIAGNOSIS — Z23 Encounter for immunization: Secondary | ICD-10-CM

## 2011-05-07 DIAGNOSIS — E890 Postprocedural hypothyroidism: Secondary | ICD-10-CM

## 2011-05-07 DIAGNOSIS — Z0000 Encounter for general adult medical examination without abnormal findings: Secondary | ICD-10-CM

## 2011-05-07 NOTE — Assessment & Plan Note (Signed)
No recent change from last thyroid test.

## 2011-05-07 NOTE — Patient Instructions (Addendum)
Ibuprofen 600 - 800 mg every 8 hours or so  Or aleve 220 2 po twice a day is antiinflammatory dose of these meds.  Can use for 10 -14 days and then as needed. COld at night strethch an arch and heel supports.Plantar Fasciitis (Heel Spur Syndrome) with Rehab The plantar fascia is a fibrous, ligament-like, soft-tissue structure that spans the bottom of the foot. Plantar fasciitis is a condition that causes pain in the foot due to inflammation of the tissue. SYMPTOMS   Pain and tenderness on the underneath side of the foot.   Pain that worsens with standing or walking.  CAUSES  Plantar fasciitis is caused by irritation and injury to the plantar fascia on the underneath side of the foot. Common mechanisms of injury include:  Direct trauma to bottom of the foot.   Damage to a small nerve that runs under the foot where the main fascia attaches to the heel bone.   Stress placed on the plantar fascia due to bone spurs.  RISK INCREASES WITH:   Activities that place stress on the plantar fascia (running, jumping, pivoting, or cutting).   Poor strength and flexibility.   Improperly fitted shoes.   Tight calf muscles.   Flat feet.   Failure to warm-up properly before activity.   Obesity.  PREVENTION  Warm up and stretch properly before activity.   Allow for adequate recovery between workouts.   Maintain physical fitness:   Strength, flexibility, and endurance.   Cardiovascular fitness.   Maintain a health body weight.   Avoid stress on the plantar fascia.   Wear properly fitted shoes, including arch supports for individuals who have flat feet.  PROGNOSIS  If treated properly, then the symptoms of plantar fasciitis usually resolve without surgery. However, occasionally surgery is necessary. RELATED COMPLICATIONS   Recurrent symptoms that may result in a chronic condition.   Problems of the lower back that are caused by compensating for the injury, such as limping.   Pain  or weakness of the foot during push-off following surgery.   Chronic inflammation, scarring, and partial or complete fascia tear, occurring more often from repeated injections.  TREATMENT  Treatment initially involves the use of ice and medication to help reduce pain and inflammation. The use of strengthening and stretching exercises may help reduce pain with activity, especially stretches of the Achilles tendon. These exercises may be performed at home or with a therapist. Your caregiver may recommend that you use heel cups of arch supports to help reduce stress on the plantar fascia. Occasionally, corticosteroid injections are given to reduce inflammation. If symptoms persist for greater than 6 months despite non-surgical (conservative), then surgery may be recommended.  MEDICATION   If pain medication is necessary, then nonsteroidal anti-inflammatory medications, such as aspirin and ibuprofen, or other minor pain relievers, such as acetaminophen, are often recommended.   Do not take pain medication within 7 days before surgery.   Prescription pain relievers may be given if deemed necessary by your caregiver. Use only as directed and only as much as you need.   Corticosteroid injections may be given by your caregiver. These injections should be reserved for the most serious cases, because they may only be given a certain number of times.  HEAT AND COLD  Cold treatment (icing) relieves pain and reduces inflammation. Cold treatment should be applied for 10 to 15 minutes every 2 to 3 hours for inflammation and pain and immediately after any activity that aggravates your symptoms. Use  ice packs or massage the area with a piece of ice (ice massage).   Heat treatment may be used prior to performing the stretching and strengthening activities prescribed by your caregiver, physical therapist, or athletic trainer. Use a heat pack or soak the injury in warm water.  SEEK IMMEDIATE MEDICAL CARE  IF:  Treatment seems to offer no benefit, or the condition worsens.   Any medications produce adverse side effects.  EXERCISES RANGE OF MOTION (ROM) AND STRETCHING EXERCISES - Plantar Fasciitis (Heel Spur Syndrome) These exercises may help you when beginning to rehabilitate your injury. Your symptoms may resolve with or without further involvement from your physician, physical therapist or athletic trainer. While completing these exercises, remember:   Restoring tissue flexibility helps normal motion to return to the joints. This allows healthier, less painful movement and activity.   An effective stretch should be held for at least 30 seconds.   A stretch should never be painful. You should only feel a gentle lengthening or release in the stretched tissue.  RANGE OF MOTION - Toe Extension, Flexion  Sit with your right / left leg crossed over your opposite knee.   Grasp your toes and gently pull them back toward the top of your foot. You should feel a stretch on the bottom of your toes and/or foot.   Hold this stretch for __________ seconds.   Now, gently pull your toes toward the bottom of your foot. You should feel a stretch on the top of your toes and or foot.   Hold this stretch for __________ seconds.  Repeat __________ times. Complete this stretch __________ times per day.  RANGE OF MOTION - Ankle Dorsiflexion, Active Assisted  Remove shoes and sit on a chair that is preferably not on a carpeted surface.   Place right / left foot under knee. Extend your opposite leg for support.   Keeping your heel down, slide your right / left foot back toward the chair until you feel a stretch at your ankle or calf. If you do not feel a stretch, slide your bottom forward to the edge of the chair, while still keeping your heel down.   Hold this stretch for __________ seconds.  Repeat __________ times. Complete this stretch __________ times per day.  STRETCH - Gastroc, Standing  Place  hands on wall.   Extend right / left leg, keeping the front knee somewhat bent.   Slightly point your toes inward on your back foot.   Keeping your right / left heel on the floor and your knee straight, shift your weight toward the wall, not allowing your back to arch.   You should feel a gentle stretch in the right / left calf. Hold this position for __________ seconds.  Repeat __________ times. Complete this stretch __________ times per day. STRETCH - Soleus, Standing  Place hands on wall.   Extend right / left leg, keeping the other knee somewhat bent.   Slightly point your toes inward on your back foot.   Keep your right / left heel on the floor, bend your back knee, and slightly shift your weight over the back leg so that you feel a gentle stretch deep in your back calf.   Hold this position for __________ seconds.  Repeat __________ times. Complete this stretch __________ times per day. STRETCH - Gastrocsoleus, Standing  Note: This exercise can place a lot of stress on your foot and ankle. Please complete this exercise only if specifically instructed by your caregiver.  Place the ball of your right / left foot on a step, keeping your other foot firmly on the same step.   Hold on to the wall or a rail for balance.   Slowly lift your other foot, allowing your body weight to press your heel down over the edge of the step.   You should feel a stretch in your right / left calf.   Hold this position for __________ seconds.   Repeat this exercise with a slight bend in your right / left knee.  Repeat __________ times. Complete this stretch __________ times per day.  STRENGTHENING EXERCISES - Plantar Fasciitis (Heel Spur Syndrome)  These exercises may help you when beginning to rehabilitate your injury. They may resolve your symptoms with or without further involvement from your physician, physical therapist or athletic trainer. While completing these exercises, remember:    Muscles can gain both the endurance and the strength needed for everyday activities through controlled exercises.   Complete these exercises as instructed by your physician, physical therapist or athletic trainer. Progress the resistance and repetitions only as guided.  STRENGTH - Towel Curls  Sit in a chair positioned on a non-carpeted surface.   Place your foot on a towel, keeping your heel on the floor.   Pull the towel toward your heel by only curling your toes. Keep your heel on the floor.   If instructed by your physician, physical therapist or athletic trainer, add ____________________ at the end of the towel.  Repeat __________ times. Complete this exercise __________ times per day. STRENGTH - Ankle Inversion  Secure one end of a rubber exercise band/tubing to a fixed object (table, pole). Loop the other end around your foot just before your toes.   Place your fists between your knees. This will focus your strengthening at your ankle.   Slowly, pull your big toe up and in, making sure the band/tubing is positioned to resist the entire motion.   Hold this position for __________ seconds.   Have your muscles resist the band/tubing as it slowly pulls your foot back to the starting position.  Repeat __________ times. Complete this exercises __________ times per day.  Document Released: 05/12/2005 Document Revised: 01/22/2011 Document Reviewed: 08/24/2008 [facility] Patient Information 2012 [place], [place].Plantar Fasciitis Plantar fasciitis is a common condition that causes foot pain. It is soreness (inflammation) of the band of tough fibrous tissue on the bottom of the foot that runs from the heel bone (calcaneus) to the ball of the foot. The cause of this soreness may be from excessive standing, poor fitting shoes, running on hard surfaces, being overweight, having an abnormal walk, or overuse (this is common in runners) of the painful foot or feet. It is also common in aerobic  exercise dancers and ballet dancers. SYMPTOMS  Most people with plantar fasciitis complain of:  Severe pain in the morning on the bottom of their foot especially when taking the first steps out of bed. This pain recedes after a few minutes of walking.   Severe pain is experienced also during walking following a long period of inactivity.   Pain is worse when walking barefoot or up stairs  DIAGNOSIS   Your caregiver will diagnose this condition by examining and feeling your foot.   Special tests such as X-rays of your foot, are usually not needed.  PREVENTION   Consult a sports medicine professional before beginning a new exercise program.   Walking programs offer a good workout. With walking there is a  lower chance of overuse injuries common to runners. There is less impact and less jarring of the joints.   Begin all new exercise programs slowly. If problems or pain develop, decrease the amount of time or distance until you are at a comfortable level.   Wear good shoes and replace them regularly.   Stretch your foot and the heel cords at the back of the ankle (Achilles tendon) both before and after exercise.   Run or exercise on even surfaces that are not hard. For example, asphalt is better than pavement.   Do not run barefoot on hard surfaces.   If using a treadmill, vary the incline.   Do not continue to workout if you have foot or joint problems. Seek professional help if they do not improve.  HOME CARE INSTRUCTIONS   Avoid activities that cause you pain until you recover.   Use ice or cold packs on the problem or painful areas after working out.   Only take over-the-counter or prescription medicines for pain, discomfort, or fever as directed by your caregiver.   Soft shoe inserts or athletic shoes with air or gel sole cushions may be helpful.   If problems continue or become more severe, consult a sports medicine caregiver or your own health care provider. Cortisone is  a potent anti-inflammatory medication that may be injected into the painful area. You can discuss this treatment with your caregiver.  MAKE SURE YOU:   Understand these instructions.   Will watch your condition.   Will get help right away if you are not doing well or get worse.  Document Released: 02/04/2001 Document Revised: 01/22/2011 Document Reviewed: 04/05/2008 [facility] Patient Information 2012 [place], [place].   Pap usually due every 3 years now

## 2011-05-07 NOTE — Assessment & Plan Note (Signed)
Better after thyroid repleted.

## 2011-05-07 NOTE — Progress Notes (Signed)
Subjective:    Patient ID: Lori Cunningham, female    DOB: 02/02/1966, 45 y.o.   MRN: [id number]  HPI Patient comes in today for preventive visit and follow-up of medical issues. Update of her history since her last visit. Platarfasciiits  Bilateral heel  Pain   On feet a lot  has tried 1 or 2 ibuprofen  encouraged shoe inserts or other management.Had weight gain and up thyroid pain that may have contributed. Myalgias are better but her feet are not . 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 Thyroid managed by Dr. Lucianne Muss stable dose at present Sleep stopped the trazodone doing better no palpitations Respiratory he has some bronchiectasis diagnosed in the past no wheezing shortness of breath but has loose cough that seems to be chronic.   Review of Systems ROS:  GEN/ HEENTNo fever, significant weight changes sweats headaches vision problems hearing changes,  does feel feverish and side but no fever CV/ PULM; No chest pain shortness of breath h, syncope,edema  change in exercise tolerance. Does get a loose cough that has been chronic. GI /GU: No adominal pain, vomiting, change in bowel habits. No blood in the stool. No significant GU symptoms. SKIN/HEME: ,no acute skin rashes suspicious lesions or bleeding. No lymphadenopathy, nodules, masses.  NEURO/ PSYCH:  No neurologic signs such as weakness numbness No depression anxiety. Sleep is better off medications IMM/ Allergy: No unusual infections.  Allergy .   REST of 12 system review negative see history of present illness Past Medical History  Diagnosis Date  . Bronchiectasis     ill from childhood pneumonia? had broch in about 1999, pft's WNL 04/12/09  . Myalgia and myositis, unspecified 09/2010    Tamel Abel  . Nontoxic multinodular goiter 07/03/10    Everardo All  . GERD (gastroesophageal reflux disease)     History   Social History  . Marital  Status: Divorced    Spouse Name: N/A    Number of Children: N/A  . Years of Education: N/A   Occupational History  . Not on file.   Social History Main Topics  . Smoking status: Former Games developer  . Smokeless tobacco: Never Used  . Alcohol Use: 1.2 oz/week    2 Glasses of wine per week  . Drug Use: No  . Sexually Active: Not on file   Other Topics Concern  . 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 Carpal tunnel release bilateral     Sypher  . Cesarean section     1994 and 2000  . Nasal turbinate reduction     deviated septum  . Bronchoscopy     in 1980's bronchiesctesis III Dr. Raj Janus    Family History  Problem Relation Age of Onset  . Diabetes Mother   . Hyperlipidemia Mother   . Other Father     sleep walking    No Known Allergies  Current Outpatient Prescriptions on File Prior to Visit  Medication Sig Dispense Refill  . docusate sodium (COLACE) 100 MG capsule Take by mouth. Take 2 by mouth at bedtime       . levothyroxine (SYNTHROID, LEVOTHROID) 100 MCG  tablet Take 100 mcg by mouth daily.          BP 120/80  Pulse 72  Ht 5' 3.5" (1.613 m)  Wt 174 lb (78.926 kg)  BMI 30.34 kg/m2  LMP 04/05/2011        Objective:   Physical Exam  Physical Exam: Vital signs reviewed [id number] is a well-developed well-nourished alert cooperative  white female who appears her stated age in no acute distress.  HEENT: normocephalic atraumatic , Eyes: PERRL EOM's full, conjunctiva clear, Nares: paten,t no deformity discharge or tenderness., Ears: no deformity EAC's clear TMs with normal landmarks. Mouth: clear OP, no lesions, edema.  Moist mucous membranes. Dentition in adequate repair. NECK: supple without masses, thyroid is palpable and  no  bruits. CHEST/PULM:  Clear to  percussion breath sounds equal wheeze musical small left base some clearing with coughing otherwise clear no  , rales or rhonchi. No chest wall deformities or tenderness. CV: PMI is nondisplaced, S1 S2 no gallops, murmurs, rubs. Peripheral pulses are full without delay.No JVD .  Breast: normal by inspection . No dimpling, discharge, masses, tenderness or discharge . ABDOMEN: Bowel sounds normal nontender  No guard or rebound, no hepato splenomegal no CVA tenderness.  No hernia. Extremtities:  No clubbing cyanosis or edema, no acute joint swelling or redness no focal atrophy some mild tenderness at the insertion of both plantar fascia NEURO:  Oriented x3, cranial nerves 3-12 appear to be intact, no obvious focal weakness,gait within normal limits no abnormal reflexes or asymmetrical SKIN: No acute rashes normal turgor, color, no bruising or petechiae. PSYCH: Oriented, good eye contact, no obvious depression anxiety, cognition and judgment appear normal. LN: no cervical axillary inguinal adenopathy       Assessment & Plan:  Preventive Health Care Continue healthy lifestyle counsel that he did give flu shot today influenza is in the community  Thyroid   Per endo Plantar fasciitis  bilaterally discussed mechanical changes handouts given ice stretch heel cups short-term anti-inflammatories to avoid GI side effects. Lungs wheeze cough remote history of bronchiectasis is seen pulmonary / no asthma would suggest consider using a mask when using housecleaning chemicals in the closed environment.

## 2011-05-07 NOTE — Assessment & Plan Note (Signed)
Currently stable.

## 2011-06-12 ENCOUNTER — Other Ambulatory Visit: Payer: Self-pay | Admitting: Internal Medicine

## 2011-06-12 DIAGNOSIS — E041 Nontoxic single thyroid nodule: Secondary | ICD-10-CM

## 2011-06-13 ENCOUNTER — Other Ambulatory Visit: Payer: Self-pay

## 2011-06-20 ENCOUNTER — Other Ambulatory Visit: Payer: No Typology Code available for payment source

## 2011-06-20 ENCOUNTER — Ambulatory Visit
Admission: RE | Admit: 2011-06-20 | Discharge: 2011-06-20 | Disposition: A | Discharge status: Home / Self Care | Payer: No Typology Code available for payment source | Source: Ambulatory Visit | Attending: Internal Medicine | Admitting: Internal Medicine

## 2011-06-20 ENCOUNTER — Other Ambulatory Visit: Payer: Self-pay | Admitting: Internal Medicine

## 2011-06-20 ENCOUNTER — Ambulatory Visit: Admission: RE | Admit: 2011-06-20 | Payer: No Typology Code available for payment source | Source: Ambulatory Visit

## 2011-06-20 DIAGNOSIS — E041 Nontoxic single thyroid nodule: Secondary | ICD-10-CM

## 2011-10-13 ENCOUNTER — Other Ambulatory Visit: Payer: Self-pay | Admitting: Internal Medicine

## 2011-10-13 MED ORDER — ESOMEPRAZOLE MAGNESIUM 40 MG PO CPDR: 40.0000 mg | DELAYED_RELEASE_CAPSULE | Freq: Every day | ORAL | Status: DC

## 2011-10-13 NOTE — Telephone Encounter (Signed)
Spoke with patient and she states she has been dieting and eating more raw vegetables in the last 1-2 months. She is experiencing a "flare up" of indigestion, heartburn and burping. She is avoiding eating at least 2 hours prior to bedtime and has elevated the HOB. She is asking for Nexium rx. She was given samples back in 06/1010. Please, advise if she need OV or just rx.

## 2011-10-13 NOTE — Telephone Encounter (Signed)
Last EGD 2009- GERD, Please send Nexiem 40 mg, #30 , 1 po dq, 3 refills.

## 2011-10-13 NOTE — Telephone Encounter (Signed)
rx sent per Dr Regino Schultze recommendations.

## 2012-02-03 ENCOUNTER — Ambulatory Visit (INDEPENDENT_AMBULATORY_CARE_PROVIDER_SITE_OTHER): Payer: Self-pay | Admitting: Internal Medicine

## 2012-02-03 ENCOUNTER — Encounter: Payer: Self-pay | Admitting: Internal Medicine

## 2012-02-03 VITALS — BP 118/70 | HR 87 | Temp 98.7°F | Wt 178.0 lb

## 2012-02-03 DIAGNOSIS — L089 Local infection of the skin and subcutaneous tissue, unspecified: Secondary | ICD-10-CM | POA: Insufficient documentation

## 2012-02-03 MED ORDER — CEPHALEXIN 500 MG PO CAPS: 500.0000 mg | ORAL_CAPSULE | Freq: Three times a day (TID) | ORAL | Status: AC

## 2012-02-03 NOTE — Progress Notes (Signed)
  Subjective:    Patient ID: Lori Cunningham, female    DOB: 1966/04/21, 46 y.o.   MRN: [id number]  HPI Patient comes in today for SDA for  new problem evaluation. Patient reports two-week problem with pain and discomfort in her left great toe. She states it was the onset after she had a pedicure where the practitioner cuts her toenails to avoid ingrowing toenails she's been using this person for years. There is no acute pain question if there is bleeding at onset she went to the beach noted there was peeling skin and redness and piece of tissue that fell out. Since that time she is persistent discomfort throbbing and redness around the base of the toe with no obvious discharge but lots of scaling. She has no insurance call the podiatrist's office associated with Junious Dresser she was told she has to pay $500 up front to be seen. Review of Systems Negative fever joint swelling. Past history family history social history reviewed in the electronic medical record.     Objective:   Physical Exam BP 118/70  Pulse 87  Temp 98.7 F (37.1 C) (Oral)  Wt 178 lb (80.74 kg)  SpO2 98%  LMP 02/01/2012 wdwn inad Left great toe with redness medial  and inferior based area without fluctuance but peeling skin and rednsess  Nail clipped straight   No ingrowing seen  ( but not at base seen)  nv ok   No spicule noted Rest of foot appears normal pedicure it.    Assessment & Plan:  Left toe  Infection  ? atypicial ? If ingrowing from manipulation   . Uncertain if procedure is indicated   Soak  Tid at least antibiotic and call in 2 days  To go out of town in 6 days  .  Onset after pedicure but  Has had these before   And no sig problems  Avoid trauma.

## 2012-02-03 NOTE — Patient Instructions (Signed)
Soak  At least 3 x per day warm  15 minutes  Can use topical antibiotic and antifungal  But  add keflex 500 tid for 7 days in the meantime . Call in 48 hours about how this is doing .  Uncertain whether procedure will fix this.  Quickly .

## 2012-02-16 ENCOUNTER — Other Ambulatory Visit: Payer: Self-pay | Admitting: Internal Medicine

## 2012-02-16 DIAGNOSIS — Z1231 Encounter for screening mammogram for malignant neoplasm of breast: Secondary | ICD-10-CM

## 2012-03-08 ENCOUNTER — Ambulatory Visit: Payer: Self-pay

## 2012-03-18 ENCOUNTER — Telehealth: Payer: Self-pay | Admitting: Internal Medicine

## 2012-03-18 NOTE — Telephone Encounter (Signed)
Patient states she has an UTI. Declined office visit. Has started old script of Macrodatin, but has only 2 days worth. Would like to ask if Dr Fabian Sharp would call script into pharmacy. [facility] [address]  [phone])

## 2012-03-18 NOTE — Telephone Encounter (Signed)
Spoke to the pt.  She will come in on 03/19/12 @ 9:45am.  Pt may arrive a few minutes late.

## 2012-03-19 ENCOUNTER — Ambulatory Visit (INDEPENDENT_AMBULATORY_CARE_PROVIDER_SITE_OTHER): Payer: Self-pay | Admitting: Internal Medicine

## 2012-03-19 ENCOUNTER — Encounter: Payer: Self-pay | Admitting: Internal Medicine

## 2012-03-19 VITALS — BP 114/74 | HR 95 | Temp 98.5°F | Wt 161.0 lb

## 2012-03-19 DIAGNOSIS — E890 Postprocedural hypothyroidism: Secondary | ICD-10-CM

## 2012-03-19 DIAGNOSIS — L089 Local infection of the skin and subcutaneous tissue, unspecified: Secondary | ICD-10-CM

## 2012-03-19 DIAGNOSIS — R399 Unspecified symptoms and signs involving the genitourinary system: Secondary | ICD-10-CM

## 2012-03-19 DIAGNOSIS — R109 Unspecified abdominal pain: Secondary | ICD-10-CM

## 2012-03-19 DIAGNOSIS — Z23 Encounter for immunization: Secondary | ICD-10-CM

## 2012-03-19 DIAGNOSIS — N926 Irregular menstruation, unspecified: Secondary | ICD-10-CM | POA: Insufficient documentation

## 2012-03-19 DIAGNOSIS — G4700 Insomnia, unspecified: Secondary | ICD-10-CM

## 2012-03-19 DIAGNOSIS — R300 Dysuria: Secondary | ICD-10-CM

## 2012-03-19 DIAGNOSIS — R3989 Other symptoms and signs involving the genitourinary system: Secondary | ICD-10-CM

## 2012-03-19 LAB — POCT URINALYSIS DIPSTICK
Bilirubin, UA: NEGATIVE
Blood, UA: 1
Glucose, UA: NEGATIVE
Ketones, UA: NEGATIVE
Leukocytes, UA: NEGATIVE
Nitrite, UA: NEGATIVE
Protein, UA: NEGATIVE
Spec Grav, UA: 1.01
Urobilinogen, UA: 0.2
pH, UA: 6.5

## 2012-03-19 LAB — T4, FREE: Free T4: 1.15 ng/dL (ref 0.60–1.60)

## 2012-03-19 LAB — TSH: TSH: 0.04 u[IU]/mL — ABNORMAL LOW (ref 0.35–5.50)

## 2012-03-19 MED ORDER — NITROFURANTOIN MONOHYD MACRO 100 MG PO CAPS: 100.0000 mg | ORAL_CAPSULE | Freq: Two times a day (BID) | ORAL | Status: DC

## 2012-03-19 NOTE — Patient Instructions (Signed)
Toe looks better keep soaking and let nail grow out. Avoid polish and glues irritants and  Trauma.  Will notify you  of labs when available. If menstrual irreg is not from  Thyroid  The go ahead and cycle with the progesterone  For 3 months  .  If  persistent or progressive irreg bleeding then need more evaluation .   Take 3 more day s of antibiotic  Suspect would be better at end of medication Flu vaccine today.

## 2012-03-19 NOTE — Progress Notes (Signed)
  Subjective:    Patient ID: Lori Cunningham, female    DOB: 12/03/1965, 46 y.o.   MRN: [id number]  HPI Patient comes in today for SDA for  new problems evaluation.  Had uti sx with dysuria no fever lower abd discomfort thi past weekend . Used macrobid from a friend 50 mg 2 bid for 5 doses and feels a lot better but some residual sx. No fever chills flank pain.  Also on menses and irreg  LMP 10 4 then off and on and spotting . Had no menses for 3 months prior.  Has been seeing Dr Gus Rankin at who helped her with her hormones  . Has changed her to armour thyroid 30  5 per day  ? Last tsh about 6 months ago. Also did  Hormone levels and said progesterone was  Low and gave her rx for provera cycling . But she hasnt't taken it .   When she was hyperthyroid she had similar bleeding.  Trying to not go back cause of cost of Dr Martyn Ehrich visits asks if we can monitor her thyroid.  Has also been on anxiety medicine and sleep medicine via Dr Shawnee Knapp and this has helped her energy . Has been able to lose weight   And feels better  On buspar 10 bid  and restoril .15   Toe some better still a bit red . Marland Kitchen Review of Systems NO cp  sob vision change  Etc see above  Past history family history social history reviewed in the electronic medical record.    Objective:   Physical Exam BP 114/74  Pulse 95  Temp 98.5 F (36.9 C) (Oral)  Wt 161 lb (73.029 kg)  SpO2 98%  LMP 02/27/2012  WDWN in nad Chest:  Clear to A&P without wheezes rales or rhonchi CV:  S1-S2 no gallops or murmurs peripheral perfusion is normal Abdomen:  Sof,t normal bowel sounds without hepatosplenomegaly, no guarding rebound or masses no CVA tenderness Wt Readings from Last 3 Encounters:  03/19/12 161 lb (73.029 kg)  02/03/12 178 lb (80.74 kg)  05/07/11 174 lb (78.926 kg)   Great toe looks good minimal redness no oozing  Nail growing out medially   ua blood 1+ neg otherwise on screen ? On  menses    Assessment & Plan:  Dysuria   Partial self rx with macrobid  Improved  rx another 3 days of meds a this time Irreg menses   Evaluated bu dr Alessandra Bevels no records available but given cycling provera not yet taken  Pt says similar sx when was hyperthyroid. So check this  And if not the culprit go ahead and cycle for 3 months. If  persistent or progressive may need further work up.  Anxiety sleep mood   Better on buspar and restoril  Pt aware of dependent nature of med but I concur that if working well at this time fu with psych care and continue .   Weight loss  prob helped by better sleep and mood.   Continue healthy lsi.  Toe nail looks much better continue Check tfts today

## 2012-03-20 ENCOUNTER — Encounter: Payer: Self-pay | Admitting: Internal Medicine

## 2012-03-20 MED ORDER — TEMAZEPAM 15 MG PO CAPS: 15.0000 mg | ORAL_CAPSULE | Freq: Every evening | ORAL | Status: DC | PRN

## 2012-03-24 ENCOUNTER — Other Ambulatory Visit: Payer: Self-pay | Admitting: Family Medicine

## 2012-03-24 DIAGNOSIS — E039 Hypothyroidism, unspecified: Secondary | ICD-10-CM

## 2012-04-13 ENCOUNTER — Ambulatory Visit
Admission: RE | Admit: 2012-04-13 | Discharge: 2012-04-13 | Disposition: A | Discharge status: Home / Self Care | Payer: Self-pay | Source: Ambulatory Visit | Attending: Internal Medicine | Admitting: Internal Medicine

## 2012-04-13 DIAGNOSIS — Z1231 Encounter for screening mammogram for malignant neoplasm of breast: Secondary | ICD-10-CM

## 2012-09-17 ENCOUNTER — Encounter: Payer: Self-pay | Admitting: Internal Medicine

## 2012-09-17 ENCOUNTER — Telehealth: Payer: Self-pay | Admitting: Internal Medicine

## 2012-09-17 ENCOUNTER — Ambulatory Visit (INDEPENDENT_AMBULATORY_CARE_PROVIDER_SITE_OTHER): Payer: Self-pay | Admitting: Internal Medicine

## 2012-09-17 ENCOUNTER — Telehealth: Payer: Self-pay | Admitting: Family Medicine

## 2012-09-17 VITALS — BP 108/74 | HR 69 | Temp 98.5°F | Wt 138.0 lb

## 2012-09-17 DIAGNOSIS — J309 Allergic rhinitis, unspecified: Secondary | ICD-10-CM | POA: Insufficient documentation

## 2012-09-17 DIAGNOSIS — H5789 Other specified disorders of eye and adnexa: Secondary | ICD-10-CM

## 2012-09-17 HISTORY — DX: Other specified disorders of eye and adnexa: H57.89

## 2012-09-17 MED ORDER — OLOPATADINE HCL 0.2 % OP SOLN: 1.0000 [drp] | Freq: Every day | OPHTHALMIC | Status: DC

## 2012-09-17 MED ORDER — PREDNISONE 20 MG PO TABS: ORAL_TABLET | ORAL | Status: DC

## 2012-09-17 NOTE — Progress Notes (Signed)
Chief Complaint  Patient presents with  . Cough    Started 2 days ago.  Has not taken any OTC medications.  . Itchy eyes  . sneezing  . Fatigue    HPI: Patient comes in today for SDA for  new problem evaluation. Awoke 2 days ago with itchy eyes but no discharge itching and swelling is around the eyes but not actually in am. Has some sneezing and congestion occasional cough is unsure she's wheezing. No fever eyes feel burning has to go to her 30 year high school reunion tomorrow. Would like to quit his treatment possible. No new hand sanitizer owrse on left side.  No fever  No meds sp far.  Uncertain what to take doesn't want to be drowsy. Has to sleep her D. in the past with good success for her allergies. ROS: See pertinent positives and negatives per HPI.  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 It is peak pollen season at this time.  Past Medical History  Diagnosis Date  . Bronchiectasis     ill from childhood pneumonia? had broch in about 1999, pft's WNL 04/12/09  . Myalgia and myositis, unspecified 09/2010    Panosh  . Nontoxic multinodular goiter 07/03/10    Everardo All  . GERD (gastroesophageal reflux disease)   . DYSPNEA 02/12/2009    Qualifier: Diagnosis of  By: Sherene Sires MD, Charlaine Dalton   . Hemorrhage of rectum and anus 10/06/2007    Qualifier: Diagnosis of  By: Fabian Sharp MD, Neta Mends   . PALPITATIONS, RECURRENT 03/22/2007    Qualifier: Diagnosis of  By: Lawernce Ion, CMA (AAMA), Bethann Berkshire Initial evaluation negative probably from thyroid disease his gone away when stopped trazodone for sleep   . PREMATURE VENTRICULAR CONTRACTIONS 05/21/2010    Qualifier: Diagnosis of  By: Antoine Poche, MD, Gerrit Heck    . Anal fissure 12/03/2007    Qualifier: Diagnosis of  By: Misty Stanley CMA (AAMA), Marchelle Folks    . Headache 10/16/2008    Qualifier: Diagnosis of  By: Everardo All MD, Cleophas Dunker     Family History  Problem  Relation Age of Onset  . Diabetes Mother   . Hyperlipidemia Mother   . Other Father     sleep walking    History   Social History  . Marital Status: Divorced    Spouse Name: N/A    Number of Children: N/A  . Years of Education: N/A   Social History Main Topics  . Smoking status: Former Games developer  . Smokeless tobacco: Never Used  . Alcohol Use: 1.2 oz/week    2 Glasses of wine per week  . Drug Use: No  . Sexually Active: None   Other Topics Concern  . 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 busPIRone (BUSPAR) 10 MG tablet Take 1 tablet (10 mg total) by mouth 2 (two) times daily. Per dr Shawnee Knapp  1 tablet  0  . docusate sodium (COLACE) 100 MG capsule Take by mouth. Take 2 by mouth at bedtime       .  temazepam (RESTORIL) 15 MG capsule Take 1 capsule (15 mg total) by mouth at bedtime as needed for sleep. Per dr Shawnee Knapp  1 capsule  0  . thyroid (ARMOUR THYROID) 30 MG tablet Take 120 mg by mouth daily. Per Dr Alessandra Bevels  1 tablet  0  . Olopatadine HCl 0.2 % SOLN Apply 1 drop to eye daily.  1 Bottle  0  . predniSONE (DELTASONE) 20 MG tablet Take 3 po qd for 2 days then 2 po qd for 3 days,or as directed  12 tablet  0  . [DISCONTINUED] nitrofurantoin, macrocrystal-monohydrate, (MACROBID) 100 MG capsule Take 1 capsule (100 mg total) by mouth 2 (two) times daily.  6 capsule  0   No facility-administered encounter medications on file as of 09/17/2012.    EXAM:  BP 108/74  Pulse 69  Temp(Src) 98.5 F (36.9 C) (Oral)  Wt 138 lb (62.596 kg)  BMI 24.06 kg/m2  SpO2 98%  LMP 08/15/2012  Body mass index is 24.06 kg/(m^2).  GENERAL: vitals reviewed and listed above, alert, oriented, appears well  hydrated and in no acute distress she has some obvious. Orbital swelling without redness looks allergic mild congestion  HEENT: atraumatic, eye eoma full  clear, but mild erythema around with a small patch of rash right medial eyelid area no obvious abnormalities on inspection of external nose and ears mild nasal congestion face nontender OP : no lesion edema or exudate airway is clear  NECK: no obvious masses on inspection palpation no adenopathy LUNGS: clear to auscultation bilaterally, no wheezes, rales or rhonchi, good air movement minor tubular sounded that clears with coughing  CV: HRRR, no clubbing cyanosis or  peripheral edema nl cap refill   MS: moves all extremities without noticeable focal  abnormality Skin no acute changes. PSYCH: pleasant and cooperative, no obvious depression or anxiety  ASSESSMENT AND PLAN:  Discussed the following assessment and plan:  Periorbital swelling with pruritis - no dx  prob allergic but context and hx  no evidence of infection   Allergic rhinitis Discussed risk benefit of medication. Patient is interested and quit just treatment for allergic symptoms because of her travel plans this weekend. We'll give 5 days of prednisone use cool compresses add antihistamine discussed eye drops in nose spray for controller medication. Followup of persistent progressive or call the on-call service.  Avoid topicals for to be problematic. -Patient advised to return or notify health care team  if symptoms worsen or persist or new concerns arise.  Patient Instructions  This appears to be reactive allergic phenomenon with the itching and swelling. Cool compressess to decrease itching and swelling, Can take the prednisone for 5 days to decrease the allergic component  Can use allergy eyedrops which is also helpful Nasal cortisone Nasacort 2 sprays in each side of the nose which is over the counter can keep allergy symptoms down during allergy season this is a good  control her medicine with few side effects. Treating nose congestion can help I symptoms also.   Antihistamines are also indicated either Allegra or Zyrtec with or without the decongestant can be helpful.     Neta Mends. Panosh M.D.

## 2012-09-17 NOTE — Telephone Encounter (Signed)
Detailed message left on personally identified home number per [facility].

## 2012-09-17 NOTE — Patient Instructions (Addendum)
This appears to be reactive allergic phenomenon with the itching and swelling. Cool compressess to decrease itching and swelling, Can take the prednisone for 5 days to decrease the allergic component  Can use allergy eyedrops which is also helpful Nasal cortisone Nasacort 2 sprays in each side of the nose which is over the counter can keep allergy symptoms down during allergy season this is a good control her medicine with few side effects. Treating nose congestion can help I symptoms also.   Antihistamines are also indicated either Allegra or Zyrtec with or without the decongestant can be helpful.

## 2012-09-17 NOTE — Telephone Encounter (Signed)
I dont know the cost of eye drops unfortunately.  They may all be  Expensive    Ask the pharmacist about cromolyn drops that may be OTC  And also zaditor has been used and is a topical antihistamine  OTC.

## 2012-09-17 NOTE — Telephone Encounter (Signed)
WP spoke to pt and informed her of all.  See other note.

## 2012-09-17 NOTE — Telephone Encounter (Signed)
Left a message on home and cell for the pt to return my call. 

## 2012-09-17 NOTE — Telephone Encounter (Signed)
Eyedrops are $110.00.Can you  Call in something else? Pt has NO insurance. [facility]

## 2012-09-23 ENCOUNTER — Telehealth: Payer: Self-pay | Admitting: Internal Medicine

## 2012-09-23 NOTE — Telephone Encounter (Signed)
Pt was seen in the office on Friday, 09/17/12 for eye redness, swelling and itching.  Pt was put on 5 days of Prednisone, Allegra once a day and OTC eye drops.  Pt states she has completed the Prednisone but her eyes are still red, swollen and itchy (on the outside).  Pt is afebrile.  Pt wants to know what she needs to do now. Can Dr Fabian Sharp prescribe the topical ointment that they had discussed or does she want to prescribe something different for patient.  Pt uses [facility] off of [address] and prefers generic prescriptions (if prescribed) because she does not have insurance.  OFFICE PLEASE FOLLOW UP WITH PATIENT.

## 2012-09-23 NOTE — Telephone Encounter (Signed)
Patient notified of all by telephone.  No infection.  Just red, itching and burning.  She will call back if problems persist.

## 2012-09-23 NOTE — Telephone Encounter (Signed)
make sure  She doesn't have infection,  did she get better on the pred does she have any discharge ?  ? Fever  Other sx  ? Nose allergy sx sneezing   Uncertain what would work  the best     Can use topical cortisone for lid rash bid with care  Desonide  .1 cr bid 15 gram  But not to get in eye.   If ongoing may need to see her eye doctor .

## 2012-11-17 ENCOUNTER — Encounter: Payer: Self-pay | Admitting: *Deleted

## 2012-12-14 ENCOUNTER — Ambulatory Visit: Payer: Self-pay | Admitting: Internal Medicine

## 2012-12-17 ENCOUNTER — Encounter: Payer: Self-pay | Admitting: Internal Medicine

## 2012-12-17 ENCOUNTER — Ambulatory Visit (INDEPENDENT_AMBULATORY_CARE_PROVIDER_SITE_OTHER): Payer: Self-pay | Admitting: Internal Medicine

## 2012-12-17 VITALS — BP 100/70 | HR 68 | Temp 98.0°F | Wt 131.0 lb

## 2012-12-17 DIAGNOSIS — Z3009 Encounter for other general counseling and advice on contraception: Secondary | ICD-10-CM

## 2012-12-17 DIAGNOSIS — Z30011 Encounter for initial prescription of contraceptive pills: Secondary | ICD-10-CM | POA: Insufficient documentation

## 2012-12-17 DIAGNOSIS — N926 Irregular menstruation, unspecified: Secondary | ICD-10-CM

## 2012-12-17 DIAGNOSIS — E890 Postprocedural hypothyroidism: Secondary | ICD-10-CM

## 2012-12-17 MED ORDER — LEVONORGEST-ETH ESTRAD 91-DAY 0.15-0.03 MG PO TABS: 1.0000 | ORAL_TABLET | Freq: Every day | ORAL | Status: DC

## 2012-12-17 NOTE — Progress Notes (Signed)
Chief Complaint  Patient presents with  . Contraception    Would like to start birth control.  Is not having regular periods.    HPI: Patient comes in today to discuss contraception options.  Is having irregular periods for a while. Has one or 2 and then may skip 3 months. Has been seeing Dr. Ananias Pilgrim alternative medicine for decrease libido is using testosterone cream and custom care progesterone 100 mg to 200 mg a day and a cyclic pattern.  Remote hx of ocps. Years ago. No known side effects. No history of recent tobacco clotting classic migraines. Using condoms currently  Thyroid is stable last TSH was in the normal range.  Has no regular medical insurance and therefore medication coverage ROS: See pertinent positives and negatives per HPI. Negative cardiovascular pulmonary present.  Past Medical History  Diagnosis Date  . Bronchiectasis     ill from childhood pneumonia? had broch in about 1999, pft's WNL 04/12/09  . Myalgia and myositis, unspecified 09/2010    Panosh  . Nontoxic multinodular goiter 07/03/10    Everardo All  . GERD (gastroesophageal reflux disease)   . DYSPNEA 02/12/2009  . Hemorrhage of rectum and anus 10/06/2007  . PALPITATIONS, RECURRENT 03/22/2007  . PREMATURE VENTRICULAR CONTRACTIONS 05/21/2010  . Anal fissure 12/03/2007  . Headache(784.0) 10/16/2008  . Eczema   . Hypothyroidism   . Depression     Family History  Problem Relation Age of Onset  . Diabetes Mother   . Hyperlipidemia Mother   . Other Father     sleep walking  . Aneurysm      grandfather  . Colon cancer Neg Hx     History   Social History  . Marital Status: Divorced    Spouse Name: N/A    Number of Children: N/A  . Years of Education: N/A   Social History Main Topics  . Smoking status: Former Games developer  . Smokeless tobacco: Never Used  . Alcohol Use: 1.2 oz/week    2 Glasses of wine per week  . Drug Use: No  . Sexually Active: None   Other Topics Concern  . 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 docusate sodium (COLACE) 100 MG capsule Take by mouth. Take 2 by mouth at bedtime       . temazepam (RESTORIL) 15 MG capsule Take 1 capsule (15 mg total) by mouth at bedtime as needed for sleep. Per dr Shawnee Knapp  1 capsule  0  . thyroid (ARMOUR THYROID) 30 MG tablet Take 120 mg by mouth daily. Per Dr Alessandra Bevels  1 tablet  0  . levonorgestrel-ethinyl estradiol (SEASONALE,INTROVALE,JOLESSA) 0.15-0.03 MG tablet Take 1 tablet by mouth daily.  1 Package  4  . [DISCONTINUED] busPIRone (BUSPAR) 10 MG tablet Take 1 tablet (10 mg total) by mouth 2 (two) times daily. Per dr Shawnee Knapp  1 tablet  0  . [DISCONTINUED] Olopatadine HCl 0.2 % SOLN Apply 1 drop to eye daily.  1 Bottle  0  . [DISCONTINUED] predniSONE (DELTASONE) 20 MG tablet Take 3 po qd for 2 days then 2 po qd for 3 days,or as directed  12 tablet  0  No facility-administered encounter medications on file as of 12/17/2012.    EXAM:  BP 100/70  Pulse 68  Temp(Src) 98 F (36.7 C) (Oral)  Wt 131 lb (59.421 kg)  BMI 22.84 kg/m2  SpO2 96%  LMP 11/13/2012  Body mass index is 22.84 kg/(m^2).  GENERAL: vitals reviewed and listed above, alert, oriented, appears well hydrated and in no acute distress looks well. MS: moves all extremities without noticeable focal  abnormality PSYCH: pleasant and cooperative, no obvious depression or anxiety  ASSESSMENT AND PLAN:  Discussed the following assessment and plan:  IRREGULAR MENSES - Plan: POCT urine pregnancy  General counseling and advice on female contraception  OCP (oral contraceptive pills) initiation  HYPOTHYROIDISM, POST-RADIATION Discussed options contraceptive counseling cost may be a  factor upfront for IUD except candidate for combined low-dose oral contraceptive pills probably would have a few side effects. Discussed monthly cycling every 3 months likely in yearly cycling prescription given for Season[place] she can cost it out UCG to be done today. Is probably perimenopausal also. Followup blood pressure and bleeding pattern in 3 months can be done through patient portal or office visit of concerns. Contact Dr. Scot Jun suggest stop the progesterone well we are on combined pills can remain on the testosterone preparation. -Patient advised to return or notify health care team  if r new concerns arise.  Patient Instructions  Can try   3 months ocps   Stop the progesterone but can stay on th testosterone,   Recheck in 3 months BP readings at that time.   Oral Contraception Information Oral contraceptives (OCs) are medicines taken to prevent pregnancy. OCs work by preventing the ovaries from releasing eggs. The hormones in OCs also cause the cervical mucus to thicken, preventing the sperm from entering the uterus. The hormones also cause the uterine lining to become thin, not allowing a fertilized egg to attach to the inside of the uterus. OCs are highly effective when taken exactly as prescribed. However, OCs do not prevent sexually transmitted diseases (STDs). Safe sex practices, such as using condoms along with the pill, can help prevent STDs.  Before taking the pill, you may have a physical exam and Pap test. Your caregiver may order blood tests that may be necessary. Your caregiver will make sure you are a good candidate for oral contraception. Discuss with your caregiver the possible side effects of the OC you may be prescribed. When starting an OC, it can take 2 to 3 months for the body to adjust to the changes in hormone levels in your body.  TYPES OF ORAL CONTRACEPTION  The combination pill. This pill contains estrogen and progestin (synthetic progesterone) hormones. The combination  pill comes in either 21-day or 28-day packs. With 21-day packs, you do not take pills for 7 days after the last pill. With 28-day packs, the pill is taken every day. The last 7 pills are without hormones. Certain types of pills have more than 21 hormone-containing pills.  The minipill. This pill contains the progesterone hormone only. It is taken every day continuously. The minipill comes in packs of 91 pills. The first 84 pills contain the hormones, and the last 7 pills do not. The last 7 days are when you will have your menstrual period. You may experience irregular spotting. ADVANTAGES  Decreases premenstrual symptoms.  Treats menstrual period cramps.  Regulates the menstrual cycle.  Decreases a heavy menstrual flow.  Treats acne.  Treats abnormal uterine bleeding.  Treats chronic pelvic pain.  Treats  polycystic ovarian syndrome.  Treats endometriosis.  Can be used as emergency contraception. DISADVANTAGES OCs can be less effective if:  You forget to take the pill at the same time every day.  You have a stomach or intestinal disease that lessens the absorption of the pill.  You take OCs with other medicines that make OCs less effective.  You take expired OCs.  You forget to restart the pill on day 7, when using the packs of 21 pills. Document Released: 08/02/2002 Document Revised: 08/04/2011 Document Reviewed: 09/18/2010 [facility] Patient Information 2014 [place], [place].    Neta Mends. Panosh M.D.

## 2012-12-17 NOTE — Patient Instructions (Addendum)
Can try   3 months ocps   Stop the progesterone but can stay on th testosterone,   Recheck in 3 months BP readings at that time.   Oral Contraception Information Oral contraceptives (OCs) are medicines taken to prevent pregnancy. OCs work by preventing the ovaries from releasing eggs. The hormones in OCs also cause the cervical mucus to thicken, preventing the sperm from entering the uterus. The hormones also cause the uterine lining to become thin, not allowing a fertilized egg to attach to the inside of the uterus. OCs are highly effective when taken exactly as prescribed. However, OCs do not prevent sexually transmitted diseases (STDs). Safe sex practices, such as using condoms along with the pill, can help prevent STDs.  Before taking the pill, you may have a physical exam and Pap test. Your caregiver may order blood tests that may be necessary. Your caregiver will make sure you are a good candidate for oral contraception. Discuss with your caregiver the possible side effects of the OC you may be prescribed. When starting an OC, it can take 2 to 3 months for the body to adjust to the changes in hormone levels in your body.  TYPES OF ORAL CONTRACEPTION  The combination pill. This pill contains estrogen and progestin (synthetic progesterone) hormones. The combination pill comes in either 21-day or 28-day packs. With 21-day packs, you do not take pills for 7 days after the last pill. With 28-day packs, the pill is taken every day. The last 7 pills are without hormones. Certain types of pills have more than 21 hormone-containing pills.  The minipill. This pill contains the progesterone hormone only. It is taken every day continuously. The minipill comes in packs of 91 pills. The first 84 pills contain the hormones, and the last 7 pills do not. The last 7 days are when you will have your menstrual period. You may experience irregular spotting. ADVANTAGES  Decreases premenstrual symptoms.  Treats  menstrual period cramps.  Regulates the menstrual cycle.  Decreases a heavy menstrual flow.  Treats acne.  Treats abnormal uterine bleeding.  Treats chronic pelvic pain.  Treats polycystic ovarian syndrome.  Treats endometriosis.  Can be used as emergency contraception. DISADVANTAGES OCs can be less effective if:  You forget to take the pill at the same time every day.  You have a stomach or intestinal disease that lessens the absorption of the pill.  You take OCs with other medicines that make OCs less effective.  You take expired OCs.  You forget to restart the pill on day 7, when using the packs of 21 pills. Document Released: 08/02/2002 Document Revised: 08/04/2011 Document Reviewed: 09/18/2010 [facility] Patient Information 2014 [place], [place].

## 2012-12-20 ENCOUNTER — Telehealth: Payer: Self-pay | Admitting: Family Medicine

## 2012-12-20 NOTE — Telephone Encounter (Signed)
She needs to ask the pharmacist  What 20 mcg or 30 mcg pill such as  loestrin  or versions of this  Cost .  Uncertain if there is much less expensive than  35- 40  $ per month.  Could still take continuously .

## 2012-12-20 NOTE — Telephone Encounter (Signed)
Patient called to report that the new rx for bc is $140.  She has no insurance.  Will need something cheaper. Uses [facility] on [address] Please advise. Thanks!

## 2012-12-21 ENCOUNTER — Other Ambulatory Visit: Payer: Self-pay | Admitting: Family Medicine

## 2012-12-21 MED ORDER — NORETHIN ACE-ETH ESTRAD-FE 1-20 MG-MCG PO TABS: 1.0000 | ORAL_TABLET | Freq: Every day | ORAL | Status: DC

## 2012-12-21 NOTE — Telephone Encounter (Signed)
Sent in Loestrin 1/20 per the pt.  For your information.

## 2012-12-24 ENCOUNTER — Encounter: Payer: Self-pay | Admitting: Internal Medicine

## 2012-12-24 ENCOUNTER — Ambulatory Visit (INDEPENDENT_AMBULATORY_CARE_PROVIDER_SITE_OTHER): Payer: Self-pay | Admitting: Internal Medicine

## 2012-12-24 VITALS — BP 100/70 | HR 64 | Ht 63.75 in | Wt 134.1 lb

## 2012-12-24 DIAGNOSIS — K602 Anal fissure, unspecified: Secondary | ICD-10-CM

## 2012-12-24 DIAGNOSIS — K625 Hemorrhage of anus and rectum: Secondary | ICD-10-CM

## 2012-12-24 NOTE — Progress Notes (Signed)
Lori Cunningham Sep 24, 1965 MRN [id number]        History of Present Illness:  This is a 47 year old white female with history of anal fissure and low-volume rectal bleeding which was initially evaluated in September 2009. She still has intermittent low-grade bleeding and associated rectal pain. Her bowel habits are regular on stool softeners and mild laxatives. She also was evaluated for dysphagia with upper endoscopy and barium esophagram. She has a hx of goiter and radioactive iodine Rx.. She no longer has the goiter but still has occasional dysphagia and a globus sensation in the back of her throat. She is here today to check on the anal fissure and rectal bleeding as well as  To ask is it is safe to continue stool softeners.   Past Medical History  Diagnosis Date  . Bronchiectasis     ill from childhood pneumonia? had broch in about 1999, pft's WNL 04/12/09  . Myalgia and myositis, unspecified 09/2010    Panosh  . Nontoxic multinodular goiter 07/03/10    Lori Cunningham  . GERD (gastroesophageal reflux disease)   . DYSPNEA 02/12/2009  . Hemorrhage of rectum and anus 10/06/2007  . PALPITATIONS, RECURRENT 03/22/2007  . PREMATURE VENTRICULAR CONTRACTIONS 05/21/2010  . Anal fissure 12/03/2007  . Headache(784.0) 10/16/2008  . Eczema   . Hypothyroidism   . Depression   . Periorbital swelling with pruritis 09/17/2012    no dx  prob allergic but context and hx  noevidence of infection     Past Surgical History  Procedure Laterality Date  . Carpal tunnel release Bilateral     Sypher  . Cesarean section      1994 and 2000  . Nasal turbinate reduction      deviated septum  . Bronchoscopy      in 1980's bronchiesctesis III Dr. Raj Janus    reports that she has quit smoking. Her smoking use included Cigarettes. She smoked 0.00 packs per day. She has never used smokeless tobacco. She reports that she drinks about 1.2 ounces of alcohol per week. She reports that she does not use illicit  drugs. family history includes Aneurysm in her paternal uncle; Diabetes in her mother; Hyperlipidemia in her mother; and Other in her father.  There is no history of Colon cancer. No Known Allergies      Review of Systems: Denies abdominal pain positive full rectal bleeding  The remainder of the 10 point ROS is negative except as outlined in H&P   Physical Exam: General appearance  Well developed, in no distress. Eyes- non icteric. HEENT nontraumatic, normocephalic. Mouth no lesions, tongue papillated, no cheilosis. Neck supple without adenopathy, thyroid not enlarged, no carotid bruits, no JVD. Lungs Clear to auscultation bilaterally. Cor normal S1, normal S2, regular rhythm, no murmur,  quiet precordium. Abdomen: Soft nontender with normoactive bowel sounds Rectal: And anoscopic exam revealed some external skin tags  one  skin tag at 12:00  extends into anal canal and represent healed anal fissure., There is no active anal fissure. There are no internal hemorrhoids. Stool is Hemoccult negative. Rectal sphincter tone is normal. There is no pain on anoscopic exam Extremities no pedal edema. Skin no lesions. Neurological alert and oriented x 3. Psychological normal mood and affect.  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 The bleeding likely occurs from the anal canal Her stool is currently  Hemoccult negative. She had a full colonoscopy 5 years ago. I told her that she doesn't need a repeat colonoscopy until she is 47 years old. In the meantime if her bleeding increases in frequency and amount  then not we consider colonoscopy just to make sure no other lesions are found. I give her samples also Resinol to apply to anal fissure.  Dysphagia. Not a serious problem. . I suggested barium esophagram and she is going to think  about  it   12/24/2012 Lori Cunningham

## 2012-12-24 NOTE — Patient Instructions (Addendum)
Dr Panosh 

## 2013-01-26 ENCOUNTER — Encounter: Payer: Self-pay | Admitting: Internal Medicine

## 2013-04-13 ENCOUNTER — Other Ambulatory Visit: Payer: Self-pay

## 2013-04-13 DIAGNOSIS — Z1231 Encounter for screening mammogram for malignant neoplasm of breast: Secondary | ICD-10-CM

## 2013-05-12 ENCOUNTER — Ambulatory Visit
Admission: RE | Admit: 2013-05-12 | Discharge: 2013-05-12 | Disposition: A | Discharge status: Home / Self Care | Payer: Self-pay | Source: Ambulatory Visit

## 2013-05-12 DIAGNOSIS — Z1231 Encounter for screening mammogram for malignant neoplasm of breast: Secondary | ICD-10-CM

## 2013-07-08 ENCOUNTER — Other Ambulatory Visit: Payer: Self-pay | Admitting: Internal Medicine

## 2013-07-08 DIAGNOSIS — E041 Nontoxic single thyroid nodule: Secondary | ICD-10-CM

## 2013-08-01 ENCOUNTER — Ambulatory Visit
Admission: RE | Admit: 2013-08-01 | Discharge: 2013-08-01 | Disposition: A | Discharge status: Home / Self Care | Payer: BC Managed Care – PPO | Source: Ambulatory Visit | Attending: Internal Medicine | Admitting: Internal Medicine

## 2013-08-01 DIAGNOSIS — E041 Nontoxic single thyroid nodule: Secondary | ICD-10-CM

## 2013-09-21 ENCOUNTER — Ambulatory Visit (INDEPENDENT_AMBULATORY_CARE_PROVIDER_SITE_OTHER): Payer: BC Managed Care – PPO | Admitting: Internal Medicine

## 2013-09-21 ENCOUNTER — Other Ambulatory Visit (HOSPITAL_COMMUNITY)
Admission: RE | Admit: 2013-09-21 | Discharge: 2013-09-21 | Disposition: A | Discharge status: Home / Self Care | Payer: BC Managed Care – PPO | Source: Ambulatory Visit | Attending: Internal Medicine | Admitting: Internal Medicine

## 2013-09-21 ENCOUNTER — Encounter: Payer: Self-pay | Admitting: Internal Medicine

## 2013-09-21 VITALS — BP 110/74 | HR 78 | Temp 98.1°F | Ht 63.75 in | Wt 147.0 lb

## 2013-09-21 DIAGNOSIS — R21 Rash and other nonspecific skin eruption: Secondary | ICD-10-CM

## 2013-09-21 DIAGNOSIS — E890 Postprocedural hypothyroidism: Secondary | ICD-10-CM

## 2013-09-21 DIAGNOSIS — R87810 Cervical high risk human papillomavirus (HPV) DNA test positive: Secondary | ICD-10-CM | POA: Insufficient documentation

## 2013-09-21 DIAGNOSIS — N760 Acute vaginitis: Secondary | ICD-10-CM | POA: Insufficient documentation

## 2013-09-21 DIAGNOSIS — Z113 Encounter for screening for infections with a predominantly sexual mode of transmission: Secondary | ICD-10-CM | POA: Insufficient documentation

## 2013-09-21 DIAGNOSIS — Z1151 Encounter for screening for human papillomavirus (HPV): Secondary | ICD-10-CM | POA: Insufficient documentation

## 2013-09-21 DIAGNOSIS — Z01419 Encounter for gynecological examination (general) (routine) without abnormal findings: Secondary | ICD-10-CM

## 2013-09-21 DIAGNOSIS — Z79890 Hormone replacement therapy (postmenopausal): Secondary | ICD-10-CM

## 2013-09-21 DIAGNOSIS — Z0000 Encounter for general adult medical examination without abnormal findings: Secondary | ICD-10-CM

## 2013-09-21 DIAGNOSIS — J069 Acute upper respiratory infection, unspecified: Secondary | ICD-10-CM | POA: Insufficient documentation

## 2013-09-21 NOTE — Patient Instructions (Addendum)
hrt can help menopausal sx of flushes and sleep issue   . This appears tpoe be reasonably safe but usually reevaluate about when to stop after 5-6 years. Estrogen patch twice a week and Prometrium once a day 100 mg   .   I dont do levels of hormones except specific situation  Where therapy doesn't seem to help. Will notify you  of labs/psp when available. Your cold may last 1-2 weeks but should feel better in a week.

## 2013-09-21 NOTE — Progress Notes (Signed)
Chief Complaint  Patient presents with  . Annual Exam    Complains of having a cold.    HPI: Patient comes in today for Preventive Health Care visit  Other care   From dr Sharol Roussel who giver her armour thyroid and topical estrogen and  testosterone and oral cyclic Prometrium  Now has insurance tired o fusing creams  ? Disc other options? Wants to have pap sti check no sx  Has a cold today no fever . Hoarse cough  abit of wheeze  Last pap  2011  Due.  In menopausal.   Dr Nettie Elm her for rash using tmc not that helpful erratic round patches that itch arms right and shoulders  Also on face  ( uses no makeup) Tob no  ETOH Caffient Seat belts  hh of 2 daughter  1 partner  Exercise  Yes Taking vit a and vit d per dr Sharol Roussel who checks levels   Health Maintenance  Topic Date Due  . Pap Smear  02/04/2013  . Influenza Vaccine  12/24/2013  . Tetanus/tdap  03/21/2017   Health Maintenance Review  Rash hrt  ROS:  Asks about dec libido with time and nmenopause GEN/ HEENT: No fever, significant weight changes sweats headaches vision problems hearing changes, CV/ PULM; No chest pain shortness of breath cough, syncope,edema  change in exercise tolerance. GI /GU: No adominal pain, vomiting, change in bowel habits. No blood in the stool. No significant GU symptoms. SKIN/HEME: ,no  suspicious lesions or bleeding. No lymphadenopathy, nodules, masses.  NEURO/ PSYCH:  No neurologic signs such as weakness numbness. No depression anxiety.sleep on temazepam want to wean to lower dose  IMM/ Allergy: No unusual infections.  Allergy .   REST of 12 system review negative except as per HPI   Past Medical History  Diagnosis Date  . Bronchiectasis     ill from childhood pneumonia? had broch in about 1999, pft's WNL 04/12/09  . Myalgia and myositis, unspecified 09/2010    Panosh  . Nontoxic multinodular goiter 07/03/10    [name]  . GERD (gastroesophageal reflux disease)   . DYSPNEA 02/12/2009  .  Hemorrhage of rectum and anus 10/06/2007  . PALPITATIONS, RECURRENT 03/22/2007  . PREMATURE VENTRICULAR CONTRACTIONS 05/21/2010  . Anal fissure 12/03/2007  . Headache(784.0) 10/16/2008  . Eczema   . Hypothyroidism   . Depression   . Periorbital swelling with pruritis 09/17/2012    no dx  prob allergic but context and hx  noevidence of infection      Family History  Problem Relation Age of Onset  . Diabetes Mother   . Hyperlipidemia Mother   . Other Father     sleep walking  . Aneurysm Paternal Uncle     AAA  . Colon cancer Neg Hx     History   Social History  . Marital Status: Divorced    Spouse Name: N/A    Number of Children: 2  . Years of Education: N/A   Occupational History  . self employed    Social History Main Topics  . Smoking status: Former Smoker    Types: Cigarettes  . Smokeless tobacco: Never Used  . Alcohol Use: 1.2 oz/week    2 Glasses of wine per week  . Drug Use: No  . Sexual Activity: None   Other Topics Concern  . None   Social History Narrative   Divorced works in [place] going back to school   2 daughters  Former smoker quit in 1985 smoked half a pack per day for 2 years   Social alcohol red wine   Household of 3   Minimal caffeine    Outpatient Encounter Prescriptions as of 09/21/2013  Medication Sig  . docusate sodium (COLACE) 100 MG capsule Take by mouth. Take 2 by mouth at bedtime   . NONFORMULARY OR COMPOUNDED ITEM 3 application daily. Estriol/testosterone HRT 0.6-2 mg/GM  . NONFORMULARY OR COMPOUNDED ITEM 1 application daily. Estadiol 2 mg/ml Cream  . Progesterone Micronized (PROGESTERONE PO) Take 150 mg on nights 7 thru 14 of cycle and 300 mg on nights 15 thru 28  . temazepam (RESTORIL) 15 MG capsule Take 1 capsule (15 mg total) by mouth at bedtime as needed for sleep. Per dr Jacques Earthly  . thyroid (ARMOUR THYROID) 30 MG tablet Take 120 mg by mouth daily. Per Dr Sharol Roussel  . triamcinolone cream (KENALOG) 0.1 % Apply 1  application topically 2 (two) times daily. For Rosacea  . [DISCONTINUED] norethindrone-ethinyl estradiol (JUNEL FE,GILDESS FE,LOESTRIN FE) 1-20 MG-MCG tablet Take 1 tablet by mouth daily.  . [DISCONTINUED] TESTOSTERONE TD Place 1 application onto the skin. Custom care    EXAM:  BP 110/74  Pulse 78  Temp(Src) 98.1 F (36.7 C) (Oral)  Ht 5' 3.75" (1.619 m)  Wt 147 lb (66.679 kg)  BMI 25.44 kg/m2  SpO2 98%  LMP 09/23/2012  Body mass index is 25.44 kg/(m^2).  Physical Exam: Vital signs reviewed NGE:XBMW is a well-developed well-nourished alert cooperative    who appearsr stated age in no acute distress. Mildly hoarse  HEENT: normocephalic atraumatic , Eyes: PERRL EOM's full, conjunctiva clear, Nares: paten,t no deformity discharge or tenderness., Ears: no deformity EAC's clear TMs with normal landmarks. Mouth: clear OP, no lesions, edema.  Moist mucous membranes. Dentition in adequate repair. NECK: supple without masses, thyromegaly or bruits. CHEST/PULM:  Clear to auscultation and percussion breath sounds equal no wheeze , rales or rhonchi. No chest wall deformities or tenderness. Breast: normal by inspection . No dimpling, discharge, masses, tenderness or discharge . CV: PMI is nondisplaced, S1 S2 no gallops, murmurs, rubs. Peripheral pulses are full without delay.No JVD .  ABDOMEN: Bowel sounds normal nontender  No guard or rebound, no hepato splenomegal no CVA tenderness.  No hernia. Extremtities:  No clubbing cyanosis or edema, no acute joint swelling or redness no focal atrophy NEURO:  Oriented x3, cranial nerves 3-12 appear to be intact, no obvious focal weakness,gait within normal limits no abnormal reflexes or asymmetrical SKIN:  normal turgor, color, no bruising or petechiae.  Pink small red patches r arm shoulder  Rough area on right cheek  PSYCH: Oriented, good eye contact, no obvious depression anxiety, cognition and judgment appear normal. LN: no cervical axillary inguinal  adenopathy Pelvic: NL ext GU, labia clear without lesions or rash . Vagina no lesions .Cervix: clear  UTERUS: Neg CMT Adnexa:  clear no masses . PAP done hpv gc chl trich rectal neg masses heme neg.   Lab Results  Component Value Date   WBC 6.4 10/18/2010   HGB 12.9 10/18/2010   HCT 38.9 10/18/2010   PLT 326 10/18/2010   GLUCOSE 86 01/21/2010   CHOL 161 01/21/2010   TRIG 34.0 01/21/2010   HDL 69.40 01/21/2010   LDLCALC 85 01/21/2010   ALT 21 10/18/2010   AST 21 10/18/2010   NA 140 01/21/2010   K 4.2 01/21/2010   CL 104 01/21/2010   CREATININE 0.6 01/21/2010   BUN 9  01/21/2010   CO2 27 01/21/2010   TSH 0.04* 03/19/2012    ASSESSMENT AND PLAN:  Discussed the following assessment and plan:  Visit for preventive health examination - Plan: Basic metabolic panel, CBC with Differential, Hepatic function panel, Lipid panel, TSH, PAP [[place]]  Encounter for routine gynecological examination - Plan: PAP [[place]]  HYPOTHYROIDISM, POST-RADIATION - Plan: Basic metabolic panel, CBC with Differential, Hepatic function panel, Lipid panel, TSH  URI, acute - viral self limited   Hormone replacement therapy (HRT) - per dr Sharol Roussel but would look other option  dis patch and prom may stay on current for nowrisk benefit prob benefit for sleep and sx at this time and low risk   Rash - per derm poss eczema Testosterone risk benefit may need monitoring if chooses to stay on this .  compounded prob the best option if chooses to continue Patient Care Team: Burnis Medin, MD as PCP - General Tanda Rockers, MD (Pulmonary Disease) Cammie Sickle., MD (Orthopedic Surgery) Jerrell Belfast, MD Lollie Sails, MD as Referring Physician (Internal Medicine) Comer Locket, PA-C as Physician Assistant (Physician Assistant) Lavonna Monarch, MD as Consulting Physician (Dermatology) Patient Instructions  hrt can help menopausal sx of flushes and sleep issue   . This appears tpoe be reasonably safe but usually  reevaluate about when to stop after 5-6 years. Estrogen patch twice a week and Prometrium once a day 100 mg   .   I dont do levels of hormones except specific situation  Where therapy doesn't seem to help. Will notify you  of labs/psp when available. Your cold may last 1-2 weeks but should feel better in a week.     Standley Brooking. Panosh M.D. Pre visit review using our clinic review tool, if applicable. No additional management support is needed unless otherwise documented below in the visit note.

## 2013-09-22 LAB — BASIC METABOLIC PANEL
BUN: 9 mg/dL (ref 6–23)
CO2: 28 mEq/L (ref 19–32)
Calcium: 9.1 mg/dL (ref 8.4–10.5)
Chloride: 104 mEq/L (ref 96–112)
Creatinine, Ser: 0.6 mg/dL (ref 0.4–1.2)
GFR: 113.11 mL/min (ref >60.00)
Glucose, Bld: 87 mg/dL (ref 70–99)
Potassium: 3.6 mEq/L (ref 3.5–5.1)
Sodium: 140 mEq/L (ref 135–145)

## 2013-09-22 LAB — LIPID PANEL
Cholesterol: 161 mg/dL (ref 0–200)
HDL: 79.8 mg/dL (ref >39.00)
LDL Cholesterol: 76 mg/dL (ref 0–99)
Total CHOL/HDL Ratio: 2
Triglycerides: 28 mg/dL (ref 0.0–149.0)
VLDL: 5.6 mg/dL (ref 0.0–40.0)

## 2013-09-22 LAB — HEPATIC FUNCTION PANEL
ALT: 12 U/L (ref 0–35)
AST: 19 U/L (ref 0–37)
Albumin: 3.8 g/dL (ref 3.5–5.2)
Alkaline Phosphatase: 82 U/L (ref 39–117)
Bilirubin, Direct: 0 mg/dL (ref 0.0–0.3)
Total Bilirubin: 0.3 mg/dL (ref 0.3–1.2)
Total Protein: 7.1 g/dL (ref 6.0–8.3)

## 2013-09-22 LAB — CBC WITH DIFFERENTIAL/PLATELET
Basophils Absolute: 0 10*3/uL (ref 0.0–0.1)
Basophils Relative: 0.3 % (ref 0.0–3.0)
Eosinophils Absolute: 0.1 10*3/uL (ref 0.0–0.7)
Eosinophils Relative: 1.8 % (ref 0.0–5.0)
HCT: 39.4 % (ref 36.0–46.0)
Hemoglobin: 13.3 g/dL (ref 12.0–15.0)
Lymphocytes Relative: 25.8 % (ref 12.0–46.0)
Lymphs Abs: 1.9 10*3/uL (ref 0.7–4.0)
MCHC: 33.8 g/dL (ref 30.0–36.0)
MCV: 89.9 fl (ref 78.0–100.0)
Monocytes Absolute: 0.7 10*3/uL (ref 0.1–1.0)
Monocytes Relative: 9.9 % (ref 3.0–12.0)
Neutro Abs: 4.6 10*3/uL (ref 1.4–7.7)
Neutrophils Relative %: 62.2 % (ref 43.0–77.0)
Platelets: 231 10*3/uL (ref 150.0–400.0)
RBC: 4.38 Mil/uL (ref 3.87–5.11)
RDW: 12.8 % (ref 11.5–14.6)
WBC: 7.4 10*3/uL (ref 4.5–10.5)

## 2013-09-22 LAB — TSH: TSH: 0 u[IU]/mL — ABNORMAL LOW (ref 0.35–5.50)

## 2013-09-26 ENCOUNTER — Encounter: Payer: Self-pay | Admitting: Internal Medicine

## 2013-09-26 DIAGNOSIS — R87810 Cervical high risk human papillomavirus (HPV) DNA test positive: Secondary | ICD-10-CM | POA: Insufficient documentation

## 2013-09-28 LAB — CERVICOVAGINAL ANCILLARY ONLY
Bacterial vaginitis: POSITIVE — AB
Candida vaginitis: NEGATIVE

## 2013-09-29 LAB — CERVICOVAGINAL ANCILLARY ONLY: Herpes: NEGATIVE

## 2013-10-10 ENCOUNTER — Other Ambulatory Visit: Payer: Self-pay | Admitting: Dermatology

## 2014-02-21 ENCOUNTER — Other Ambulatory Visit (HOSPITAL_COMMUNITY): Payer: Self-pay | Admitting: Orthopaedic Surgery

## 2014-02-21 DIAGNOSIS — S82899A Other fracture of unspecified lower leg, initial encounter for closed fracture: Secondary | ICD-10-CM

## 2014-03-03 ENCOUNTER — Ambulatory Visit (HOSPITAL_COMMUNITY)
Admission: RE | Admit: 2014-03-03 | Discharge: 2014-03-03 | Disposition: A | Discharge status: Home / Self Care | Payer: BC Managed Care – PPO | Source: Ambulatory Visit | Attending: Orthopaedic Surgery | Admitting: Orthopaedic Surgery

## 2014-03-03 DIAGNOSIS — Z8781 Personal history of (healed) traumatic fracture: Secondary | ICD-10-CM | POA: Diagnosis not present

## 2014-03-03 DIAGNOSIS — S82899A Other fracture of unspecified lower leg, initial encounter for closed fracture: Secondary | ICD-10-CM

## 2014-03-03 DIAGNOSIS — Z13820 Encounter for screening for osteoporosis: Secondary | ICD-10-CM | POA: Insufficient documentation

## 2014-03-20 ENCOUNTER — Other Ambulatory Visit: Payer: Self-pay | Admitting: Dermatology

## 2014-04-24 ENCOUNTER — Other Ambulatory Visit: Payer: Self-pay

## 2014-04-24 DIAGNOSIS — Z1231 Encounter for screening mammogram for malignant neoplasm of breast: Secondary | ICD-10-CM

## 2014-05-05 ENCOUNTER — Ambulatory Visit: Payer: BC Managed Care – PPO | Admitting: Internal Medicine

## 2014-06-02 ENCOUNTER — Ambulatory Visit: Payer: BC Managed Care – PPO

## 2014-06-05 ENCOUNTER — Ambulatory Visit
Admission: RE | Admit: 2014-06-05 | Discharge: 2014-06-05 | Disposition: A | Discharge status: Home / Self Care | Payer: BLUE CROSS/BLUE SHIELD | Source: Ambulatory Visit

## 2014-06-05 DIAGNOSIS — Z1231 Encounter for screening mammogram for malignant neoplasm of breast: Secondary | ICD-10-CM

## 2014-07-14 ENCOUNTER — Ambulatory Visit: Payer: BLUE CROSS/BLUE SHIELD | Admitting: Internal Medicine

## 2014-07-26 ENCOUNTER — Encounter: Payer: Self-pay | Admitting: Gastroenterology

## 2014-08-11 ENCOUNTER — Ambulatory Visit: Payer: BLUE CROSS/BLUE SHIELD | Admitting: Internal Medicine

## 2014-09-22 ENCOUNTER — Ambulatory Visit (INDEPENDENT_AMBULATORY_CARE_PROVIDER_SITE_OTHER): Payer: BLUE CROSS/BLUE SHIELD | Admitting: Internal Medicine

## 2014-09-22 ENCOUNTER — Encounter: Payer: Self-pay | Admitting: Internal Medicine

## 2014-09-22 VITALS — BP 100/58 | HR 64 | Ht 63.75 in | Wt 158.1 lb

## 2014-09-22 DIAGNOSIS — R1013 Epigastric pain: Secondary | ICD-10-CM | POA: Diagnosis not present

## 2014-09-22 DIAGNOSIS — K921 Melena: Secondary | ICD-10-CM

## 2014-09-22 DIAGNOSIS — K602 Anal fissure, unspecified: Secondary | ICD-10-CM | POA: Diagnosis not present

## 2014-09-22 MED ORDER — NA SULFATE-K SULFATE-MG SULF 17.5-3.13-1.6 GM/177ML PO SOLN: 1.0000 | Freq: Once | ORAL | Status: DC

## 2014-09-22 NOTE — Progress Notes (Signed)
Lori Cunningham 1965-08-07 [id number]  Note: This dictation was prepared with Dragon digital system. Any transcriptional errors that result from this procedure are unintentional.   History of Present Illness: This is a  49 year old white female with history of chronic anal fissure which was first evaluated in 2009 when she had colonoscopy and again in August 2014. She has done very well for past year and a half but has developed recurrent rectal bleeding about a month ago. She has so far had 3 separate episodes of painless low-volume hematochezia. She this time denies rectal pain. She takes 2 stool softeners daily . She also has dysphagia and dyspepsia. Upper endoscopy in September 2009 was unremarkable, only mild gastritis. Patient had a goiter and radioactive iodine treatment in the past    Past Medical History  Diagnosis Date  . Bronchiectasis     ill from childhood pneumonia? had broch in about 1999, pft's WNL 04/12/09  . Myalgia and myositis, unspecified 09/2010    Panosh  . Nontoxic multinodular goiter 07/03/10    [name]  . GERD (gastroesophageal reflux disease)   . DYSPNEA 02/12/2009  . Hemorrhage of rectum and anus 10/06/2007  . PALPITATIONS, RECURRENT 03/22/2007  . PREMATURE VENTRICULAR CONTRACTIONS 05/21/2010  . Anal fissure 12/03/2007  . Headache(784.0) 10/16/2008  . Eczema   . Hypothyroidism   . Depression   . Periorbital swelling with pruritis 09/17/2012    no dx  prob allergic but context and hx  noevidence of infection    . Sleep disturbance   . Plantar fasciitis   . Allergic rhinitis   . HPV test positive     Past Surgical History  Procedure Laterality Date  . Carpal tunnel release Bilateral     Sypher  . Cesarean section      1994 and 2000  . Nasal turbinate reduction      deviated septum  . Bronchoscopy      in 1980's bronchiesctesis III Dr. Consuelo Pandy    Allergies  Allergen Reactions  . Trazodone And Nefazodone Palpitations    Family history and  social history have been reviewed.  Review of Systems: Small amount of bright red blood, in the stools. Weight has been stable  The remainder of the 10 point ROS is negative except as outlined in the H&P  Physical Exam: General Appearance Well developed, in no distress Eyes  Non icteric  HEENT  Non traumatic, normocephalic  Mouth No lesion, tongue papillated, no cheilosis Neck Supple without adenopathy, thyroid not enlarged, no carotid bruits, no JVD Lungs Clear to auscultation bilaterally COR Normal S1, normal S2, regular rhythm, no murmur, quiet precordium Abdomen soft nontender abdomen with normoactive bowel sounds Rectal and anoscopic exam reveals external skin tag at 3:00 which was identical to the findings on prior endoscopy. There is a indurated healed anal fissure which extends  into the anal canal and  rectal ampulla. There is no active fissure. There is no pain on the exam. Stool is Hemoccult negative Extremities  No pedal edema Skin No lesions Neurological Alert and oriented x 3 Psychological Normal mood and affect  Assessment and Plan:   Chronic anal fissure at 3:00 currently I don't see any activity but rectal bleeding is likely attributed to the fissure opening and closing. Patient will be 49 years old in next few months and  is interested in colorectal screening. Last colonoscopy in 2009.  We will proceed with colonoscopy now. Dyspepsia and history of dysphagia. We will also proceed with upper  endoscopy at the same time     Delfin Edis 09/22/2014

## 2014-09-22 NOTE — Patient Instructions (Signed)

## 2014-09-25 ENCOUNTER — Telehealth: Payer: Self-pay | Admitting: *Deleted

## 2014-09-25 NOTE — Telephone Encounter (Signed)
Talked to patient to let them know I have a free Suprep kit to give to them. Pt stated she is going to call her insurance company up to make sure procedure will be covered. Will pickup kit before 12/01/14.

## 2014-09-25 NOTE — Telephone Encounter (Signed)
Left message for patient at [phone] (home #) to call me back. Have a free Suprep kit to give to them since insurance would not cover.

## 2014-09-28 ENCOUNTER — Encounter: Payer: Self-pay | Admitting: Internal Medicine

## 2014-12-01 ENCOUNTER — Encounter: Payer: BLUE CROSS/BLUE SHIELD | Admitting: Internal Medicine

## 2015-03-06 ENCOUNTER — Encounter: Payer: Self-pay | Admitting: Internal Medicine

## 2015-03-06 ENCOUNTER — Ambulatory Visit (INDEPENDENT_AMBULATORY_CARE_PROVIDER_SITE_OTHER): Payer: BLUE CROSS/BLUE SHIELD | Admitting: Internal Medicine

## 2015-03-06 VITALS — BP 120/80 | HR 89 | Temp 97.8°F | Wt 158.0 lb

## 2015-03-06 DIAGNOSIS — R062 Wheezing: Secondary | ICD-10-CM

## 2015-03-06 DIAGNOSIS — J45990 Exercise induced bronchospasm: Secondary | ICD-10-CM | POA: Diagnosis not present

## 2015-03-06 DIAGNOSIS — R430 Anosmia: Secondary | ICD-10-CM | POA: Diagnosis not present

## 2015-03-06 MED ORDER — ALBUTEROL SULFATE 108 (90 BASE) MCG/ACT IN AEPB: 2.0000 | INHALATION_SPRAY | RESPIRATORY_TRACT | Status: DC

## 2015-03-06 NOTE — Patient Instructions (Signed)
Can  Use  Pre exercise and as needed.  Let us know how helps .  In the month  Acts like  Asthmatic . Nasal cortisone  2 sprays eacn nostril each day for at least 1-2 weeks  For nose congestion.

## 2015-03-06 NOTE — Progress Notes (Signed)
Pre visit review using our clinic review tool, if applicable. No additional management support is needed unless otherwise documented below in the visit note.  Chief Complaint  Patient presents with  . Shortness of Breath    While active.  . Wheezing    HPI: Lori Cunningham 49 y.o. comes in today because of ongoing respiratory symptoms . And interested in trying inhalers because her current situation is affecting her exercise. She's always had some history of wheezing even when she was child but no specific diagnosis of asthma. She had one-year smoking when she was younger. No other exposures. Was post dates normal weight but put in an incubator uncertain if she had oxygen. She was seen in number of years ago by Dr. Linna Darner and Dr. Melvyn Novas because of the question of bronchiectasis.  pulmonary function tests done spirometry at rest which were normal for age 26.  See note dr Melvyn Novas 2012  Poss asthma or pseudo asthma but  No fu visit noted   She had nasal congestion with negative allergy testing years ago and had turbinate reduction which helped her nasal obstruction. She and the last 6 months has had anosmia and saw Dr. Wilburn Cornelia her ENT surgeon who is uncertain cause but added and nasal inhaler that she ran out of uncertain if it helped. She still doesn't smell well wants to know what else she can do for nasal stuffiness. She has always resisted trying using an inhaler but at this point is unable to run as well as she wants. After about 5 minutes she gets tightness in her chest and some coughing. Happens about every other day.  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 ROS: See pertinent positives and negatives per HPI.  Past Medical History  Diagnosis Date  . Bronchiectasis     ill from childhood pneumonia? had broch in about 1999, pft's WNL 04/12/09  . Myalgia and myositis, unspecified 09/2010    Randy Castrejon  . Nontoxic  multinodular goiter 07/03/10    [name]  . GERD (gastroesophageal reflux disease)   . DYSPNEA 02/12/2009  . Hemorrhage of rectum and anus 10/06/2007  . PALPITATIONS, RECURRENT 03/22/2007  . PREMATURE VENTRICULAR CONTRACTIONS 05/21/2010  . Anal fissure 12/03/2007  . Headache(784.0) 10/16/2008  . Eczema   . Hypothyroidism   . Depression   . Periorbital swelling with pruritis 09/17/2012    no dx  prob allergic but context and hx  noevidence of infection    . Sleep disturbance   . Plantar fasciitis   . Allergic rhinitis   . HPV test positive     Family History  Problem Relation Age of Onset  . Diabetes Mother   . Hyperlipidemia Mother   . Other Father     sleep walking  . Aneurysm Paternal Uncle     AAA  . Colon cancer Neg Hx     Social History   Social History  . Marital Status: Divorced    Spouse Name: N/A  . Number of Children: 2  . Years of Education: N/A   Occupational History  . self employed    Social History Main Topics  . Smoking status: Former Smoker    Types: Cigarettes  . Smokeless tobacco: Never Used  . Alcohol Use: 1.2 oz/week    2 Glasses of wine per week  . Drug Use: No  . Sexual Activity: Not Asked   Other Topics Concern  . None  Social History Narrative   Divorced works in [place] going back to school   2 daughters   Former smoker quit in 1985 smoked half a pack per day for 2 years   Social alcohol red wine   Household of 3   Minimal caffeine    Outpatient Prescriptions Prior to Visit  Medication Sig Dispense Refill  . docusate sodium (COLACE) 100 MG capsule Take by mouth. Take 2 by mouth at bedtime     . Na Sulfate-K Sulfate-Mg Sulf (SUPREP BOWEL PREP) SOLN Take 1 kit by mouth once. 1 Bottle 0  . NATURE-THROID 97.5 MG TABS Take 4 tablets by mouth daily.   3  . NONFORMULARY OR COMPOUNDED ITEM 3 application daily. Estriol/testosterone HRT 0.6-2 mg/GM    . Progesterone Micronized (PROGESTERONE PO) Take 150 mg on nights 7 thru  14 of cycle and 300 mg on nights 15 thru 28    . estradiol (VIVELLE-DOT) 0.075 MG/24HR Place 1 patch onto the skin 2 (two) times a week.   6  . thyroid (ARMOUR THYROID) 30 MG tablet Take 120 mg by mouth daily. Per Dr Sharol Roussel 1 tablet 0   No facility-administered medications prior to visit.     EXAM:  BP 120/80 mmHg  Pulse 89  Temp(Src) 97.8 F (36.6 C) (Oral)  Wt 158 lb (71.668 kg)  SpO2 99%  LMP 09/23/2012  Body mass index is 27.34 kg/(m^2).  GENERAL: vitals reviewed and listed above, alert, oriented, appears well hydrated and in no acute distress HEENT: atraumatic, conjunctiva  clear, no obvious abnormalities on inspection of external nose and ears OP : no lesion edema or exudate face nontender nares no obvious lesions. NECK: no obvious masses on inspection palpation  LUNGS:   Breath sounds equal occasional scattered wheeze bronchial cough fairly loose CV: HRRR, no clubbing cyanosis or  peripheral edema nl cap refill  MS: moves all extremities without noticeable focal  abnormality PSYCH: pleasant and cooperative, no obvious depression or anxiety  ASSESSMENT AND PLAN:  Discussed the following assessment and plan:  Exercise induced bronchospasm  Wheezing - History of same worse with exercise sounds like exercise-induced asthma normal spirometry 2010 at rest no nocturnal symptoms or asthma attacks per se  Anosmia - Uncertain cause has been seen by ENT Discussed options intervention him. Coupon given for [facility] which is a dry powder as a trial first. Use 15-30 minutes before exercise and every 6 hours as needed. Contact us in 3-4 weeks about how this is working we can refill. If not helping we can intensify to the next step evaluation perhaps recheck PFTs etc.  Can try nasal steroid trial Nasacort or Flonase for at least 2 weeks over-the-counter to see if this helps. Uncertain to what make of the anosmia she thinks it might be related to inhaled household cleaners  etc. -Patient advised to return or notify health care team  if symptoms worsen ,persist or new concerns arise.  Patient Instructions  Can  Use  Pre exercise and as needed.  Let us know how helps .  In the month  Acts like  Asthmatic . Nasal cortisone  2 sprays eacn nostril each day for at least 1-2 weeks  For nose congestion.    Standley Brooking. Esthefany Herrig M.D.

## 2015-03-20 ENCOUNTER — Encounter: Payer: Self-pay | Admitting: Internal Medicine

## 2015-03-20 ENCOUNTER — Ambulatory Visit (INDEPENDENT_AMBULATORY_CARE_PROVIDER_SITE_OTHER): Payer: BLUE CROSS/BLUE SHIELD | Admitting: Internal Medicine

## 2015-03-20 ENCOUNTER — Other Ambulatory Visit (HOSPITAL_COMMUNITY)
Admission: RE | Admit: 2015-03-20 | Discharge: 2015-03-20 | Disposition: A | Discharge status: Home / Self Care | Payer: BLUE CROSS/BLUE SHIELD | Source: Ambulatory Visit | Attending: Internal Medicine | Admitting: Internal Medicine

## 2015-03-20 VITALS — BP 124/74 | Temp 98.2°F | Ht 63.5 in | Wt 155.3 lb

## 2015-03-20 DIAGNOSIS — R062 Wheezing: Secondary | ICD-10-CM

## 2015-03-20 DIAGNOSIS — J45990 Exercise induced bronchospasm: Secondary | ICD-10-CM | POA: Diagnosis not present

## 2015-03-20 DIAGNOSIS — Z01419 Encounter for gynecological examination (general) (routine) without abnormal findings: Secondary | ICD-10-CM

## 2015-03-20 DIAGNOSIS — Z0000 Encounter for general adult medical examination without abnormal findings: Secondary | ICD-10-CM

## 2015-03-20 DIAGNOSIS — Z1211 Encounter for screening for malignant neoplasm of colon: Secondary | ICD-10-CM

## 2015-03-20 DIAGNOSIS — Z1151 Encounter for screening for human papillomavirus (HPV): Secondary | ICD-10-CM | POA: Diagnosis not present

## 2015-03-20 DIAGNOSIS — R87810 Cervical high risk human papillomavirus (HPV) DNA test positive: Secondary | ICD-10-CM

## 2015-03-20 MED ORDER — MOMETASONE FUROATE 220 MCG/INH IN AEPB: 1.0000 | INHALATION_SPRAY | Freq: Every day | RESPIRATORY_TRACT | Status: DC

## 2015-03-20 NOTE — Progress Notes (Signed)
Pre visit review using our clinic review tool, if applicable. No additional management support is needed unless otherwise documented below in the visit note.  Chief Complaint  Patient presents with  . Annual Exam    pap    HPI: Patient  Lori Cunningham  49 y.o. comes in today for [place] visit  Thyroid being seen by haley  ? considieration of   Allergy testing  Still nasal congestion has ?s  using albuterol most day s lmp July   Last one a year previous no spotting or sx.  Last pap 2 years ago had high risk hpv but normal . Last mammo 2016 jan   Not high risk   Health Maintenance  Topic Date Due  . HIV Screening  02/06/1980  . INFLUENZA VACCINE  10/24/2015 (Originally 12/25/2014)  . MAMMOGRAM  06/05/2016  . PAP SMEAR  09/21/2016  . TETANUS/TDAP  03/21/2017  . COLONOSCOPY  02/20/2018   Health Maintenance Review LIFESTYLE:  Exercise:   Active  Tobacco/ETS:n Alcohol:   Sugar beverages:n Sleep:yes Drug use: no   ROS:   See hpi  GEN/ HEENT: No fever, significant weight changes sweats headaches vision problems hearing changes, CV/ PULM; No chest painsee above cough wheeze no fever GI /GU: No adominal pain, vomiting, change in bowel habits. No blood in the stool. No significant GU symptoms. SKIN/HEME: ,no acute skin rashes suspicious lesions or bleeding. No lymphadenopathy, nodules, masses.  NEURO/ PSYCH:  No neurologic signs such as weakness numbness. No depression anxiety. IMM/ Allergy: No unusual infections.  Allergy .   REST of 12 system review negative except as per HPI   Past Medical History  Diagnosis Date  . Bronchiectasis     ill from childhood pneumonia? had broch in about 1999, pft's WNL 04/12/09  . Myalgia and myositis, unspecified 09/2010    Talisha Erby  . Nontoxic multinodular goiter 07/03/10    [name]  . GERD (gastroesophageal reflux disease)   . DYSPNEA 02/12/2009  . Hemorrhage of rectum and anus 10/06/2007  . PALPITATIONS, RECURRENT  03/22/2007  . PREMATURE VENTRICULAR CONTRACTIONS 05/21/2010  . Anal fissure 12/03/2007  . Headache(784.0) 10/16/2008  . Eczema   . Hypothyroidism   . Depression   . Periorbital swelling with pruritis 09/17/2012    no dx  prob allergic but context and hx  noevidence of infection    . Sleep disturbance   . Plantar fasciitis   . Allergic rhinitis   . HPV test positive     Past Surgical History  Procedure Laterality Date  . Carpal tunnel release Bilateral     Syp[facility]  . Cesarean section      1994 and 2000  . Nasal turbinate reduction      deviated septum  . Bronchoscopy      in 1980's bronchiesctesis III Dr. Consuelo Pandy    Family History  Problem Relation Age of Onset  . Diabetes Mother   . Hyperlipidemia Mother   . Other Father     sleep walking  . Aneurysm Paternal Uncle     AAA  . Colon cancer Neg Hx     Social History   Social History  . Marital Status: Divorced    Spouse Name: N/A  . Number of Children: 2  . Years of Education: N/A   Occupational History  . self employed    Social History Main Topics  . Smoking status: Former Smoker    Types: Cigarettes  . Smokeless tobacco:  Never Used  . Alcohol Use: 1.2 oz/week    2 Glasses of wine per week  . Drug Use: No  . Sexual Activity: Not Asked   Other Topics Concern  . None   Social History Narrative   Divorced works in [place] going back to school   2 daughters   Former smoker quit in 1985 smoked half a pack per day for 2 years   Social alcohol red wine   Household of 3   Minimal caffeine    Outpatient Prescriptions Prior to Visit  Medication Sig Dispense Refill  . Albuterol Sulfate (PROAIR RESPICLICK) 389 (90 BASE) MCG/ACT AEPB Inhale 2 Act into the lungs as directed. Pre exercise 15- 30 minutes 1 each 2  . docusate sodium (COLACE) 100 MG capsule Take by mouth. Take 2 by mouth at bedtime     . NONFORMULARY OR COMPOUNDED ITEM 3 application daily. Estriol/testosterone HRT 0.6-2 mg/GM    .  Progesterone Micronized (PROGESTERONE PO) Take 150 mg on nights 7 thru 14 of cycle and 300 mg on nights 15 thru 28    . NATURE-THROID 97.5 MG TABS Take 4 tablets by mouth daily.   3  . Na Sulfate-K Sulfate-Mg Sulf (SUPREP BOWEL PREP) SOLN Take 1 kit by mouth once. (Patient not taking: Reported on 03/20/2015) 1 Bottle 0   No facility-administered medications prior to visit.     EXAM:  BP 124/74 mmHg  Temp(Src) 98.2 F (36.8 C) (Oral)  Ht 5' 3.5" (1.613 m)  Wt 155 lb 4.8 oz (70.444 kg)  BMI 27.08 kg/m2  LMP 09/23/2012  Body mass index is 27.08 kg/(m^2).  Physical Exam: Vital signs reviewed HTD:SKAJ is a well-developed well-nourished alert cooperative    who appearsr stated age in no acute distress.  HEENT: normocephalic atraumatic , Eyes: PERRL EOM's full, conjunctiva clear, Nares: paten,t no deformity discharge or tenderness. Mild congestion, Ears: no deformity EAC's clear TMs with normal landmarks. Mouth: clear OP, no lesions, edema.  Moist mucous membranes. Dentition in adequate repair. NECK: supple without masses, thyromegaly or bruits. CHEST/PULM:  Clear to auscultation and percussion breath sounds equal rare lll wheeze , norales or rhonchi. No chest wall deformities or tenderness. CV: PMI is nondisplaced, S1 S2 no gallops, murmurs, rubs. Peripheral pulses are full without delay.No JVD .  ABDOMEN: Bowel sounds normal nontender  No guard or rebound, no hepato splenomegal no CVA tenderness.  No hernia. Extremtities:  No clubbing cyanosis or edema, no acute joint swelling or redness no focal atrophy NEURO:  Oriented x3, cranial nerves 3-12 appear to be intact, no obvious focal weakness,gait within normal limits no abnormal reflexes or asymmetrical SKIN: No acute rashes normal turgor, color, no bruising or petechiae. PSYCH: Oriented, good eye contact, no obvious depression anxiety, cognition and judgment appear normal. LN: no cervical axillary inguinal adenopathy Pelvic: NL ext GU,  labia clear without lesions or rash . Vagina no lesions .Cervix: clear  Os no lesion noted  Firm on palpation  UTERUS: Neg CMT Adnexa:  clear no masses . PAP done hi risk hpv   ASSESSMENT AND PLAN:  Discussed the following assessment and plan:  Visit for preventive health examination - Plan: PAP [[place]]  Encounter for routine gynecological examination - Plan: PAP [[place]]  Exercise induced bronchospasm - Plan: DG Chest 2 View  Wheezing - Plan: DG Chest 2 View  Colon cancer screening - Plan: Ambulatory referral to Gastroenterology  Cervical high risk HPV (human papillomavirus) test positive hx of  -  5 15 repeat with cotesting  if abnormal send to gyne colposcopy by protochol   Patient Care Team: Burnis Medin, MD as PCP - General Tanda Rockers, MD (Pulmonary Disease) Theodis Sato, MD (Orthopedic Surgery) Jerrell Belfast, MD Lollie Sails, MD as Referring Physician (Internal Medicine) Comer Locket, PA-C as Physician Assistant (Physician Assistant) Lavonna Monarch, MD as Consulting Physician (Dermatology) Patient Instructions  Will notify you when pap results are available.  Sent in cortisone inhaler for lungs   To take once a day to see if helps suppress the cough wheezing . considier chest x ray if not done in the last year or so. Agree withe allergy evaluation Will do colonoscopy referral .   Health Maintenance, Female Adopting a healthy lifestyle and getting preventive care can go a long way to promote health and wellness. Talk with your health care provider about what schedule of regular examinations is right for you. This is a good chance for you to check in with your provider about disease prevention and staying healthy. In between checkups, there are plenty of things you can do on your own. Experts have done a lot of research about which lifestyle changes and preventive measures are most likely to keep you healthy. Ask your health care provider for more  information. WEIGHT AND DIET  Eat a healthy diet  Be sure to include plenty of vegetables, fruits, low-fat dairy products, and lean protein.  Do not eat a lot of foods high in solid fats, added sugars, or salt.  Get regular exercise. This is one of the most important things you can do for your health.  Most adults should exercise for at least 150 minutes each week. The exercise should increase your heart rate and make you sweat (moderate-intensity exercise).  Most adults should also do strengthening exercises at least twice a week. This is in addition to the moderate-intensity exercise.  Maintain a healthy weight  Body mass index (BMI) is a measurement that can be used to identify possible weight problems. It estimates body fat based on height and weight. Your health care provider can help determine your BMI and help you achieve or maintain a healthy weight.  For females 24 years of age and older:   A BMI below 18.5 is considered underweight.  A BMI of 18.5 to 24.9 is normal.  A BMI of 25 to 29.9 is considered overweight.  A BMI of 30 and above is considered obese.  Watch levels of cholesterol and blood lipids  You should start having your blood tested for lipids and cholesterol at 50 years of age, then have this test every 5 years.  You may need to have your cholesterol levels checked more often if:  Your lipid or cholesterol levels are high.  You are older than 50 years of age.  You are at high risk for heart disease.  CANCER SCREENING   Lung Cancer  Lung cancer screening is recommended for adults 18-40 years old who are at high risk for lung cancer because of a history of smoking.  A yearly low-dose CT scan of the lungs is recommended for people who:  Currently smoke.  Have quit within the past 15 years.  Have at least a 30-pack-year history of smoking. A pack year is smoking an average of one pack of cigarettes a day for 1 year.  Yearly screening should  continue until it has been 15 years since you quit.  Yearly screening should stop if you develop a health  problem that would prevent you from having lung cancer treatment.  Breast Cancer  Practice breast self-awareness. This means understanding how your breasts normally appear and feel.  It also means doing regular breast self-exams. Let your health care provider know about any changes, no matter how small.  If you are in your 20s or 30s, you should have a clinical breast exam (CBE) by a health care provider every 1-3 years as part of a regular health exam.  If you are 19 or older, have a CBE every year. Also consider having a breast X-ray (mammogram) every year.  If you have a family history of breast cancer, talk to your health care provider about genetic screening.  If you are at high risk for breast cancer, talk to your health care provider about having an MRI and a mammogram every year.  Breast cancer gene (BRCA) assessment is recommended for women who have family members with BRCA-related cancers. BRCA-related cancers include:  Breast.  Ovarian.  Tubal.  Peritoneal cancers.  Results of the assessment will determine the need for genetic counseling and BRCA1 and BRCA2 testing. Cervical Cancer Your health care provider may recommend that you be screened regularly for cancer of the pelvic organs (ovaries, uterus, and vagina). This screening involves a pelvic examination, including checking for microscopic changes to the surface of your cervix (Pap test). You may be encouraged to have this screening done every 3 years, beginning at age 77.  For women ages 58-65, health care providers may recommend pelvic exams and Pap testing every 3 years, or they may recommend the Pap and pelvic exam, combined with testing for human papilloma virus (HPV), every 5 years. Some types of HPV increase your risk of cervical cancer. Testing for HPV may also be done on women of any age with unclear Pap  test results.  Other health care providers may not recommend any screening for nonpregnant women who are considered low risk for pelvic cancer and who do not have symptoms. Ask your health care provider if a screening pelvic exam is right for you.  If you have had past treatment for cervical cancer or a condition that could lead to cancer, you need Pap tests and screening for cancer for at least 20 years after your treatment. If Pap tests have been discontinued, your risk factors (such as having a new sexual partner) need to be reassessed to determine if screening should resume. Some women have medical problems that increase the chance of getting cervical cancer. In these cases, your health care provider may recommend more frequent screening and Pap tests. Colorectal Cancer  This type of cancer can be detected and often prevented.  Routine colorectal cancer screening usually begins at 50 years of age and continues through 50 years of age.  Your health care provider may recommend screening at an earlier age if you have risk factors for colon cancer.  Your health care provider may also recommend using home test kits to check for hidden blood in the stool.  A small camera at the end of a tube can be used to examine your colon directly (sigmoidoscopy or colonoscopy). This is done to check for the earliest forms of colorectal cancer.  Routine screening usually begins at age 59.  Direct examination of the colon should be repeated every 5-10 years through 50 years of age. However, you may need to be screened more often if early forms of precancerous polyps or small growths are found. Skin Cancer  Check your skin  from head to toe regularly.  Tell your health care provider about any new moles or changes in moles, especially if there is a change in a mole's shape or color.  Also tell your health care provider if you have a mole that is larger than the size of a pencil eraser.  Always use sunscreen.  Apply sunscreen liberally and repeatedly throughout the day.  Protect yourself by wearing long sleeves, pants, a wide-brimmed hat, and sunglasses whenever you are outside. HEART DISEASE, DIABETES, AND HIGH BLOOD PRESSURE   High blood pressure causes heart disease and increases the risk of stroke. High blood pressure is more likely to develop in:  People who have blood pressure in the high end of the normal range (130-139/85-89 mm Hg).  People who are overweight or obese.  People who are African American.  If you are 42-36 years of age, have your blood pressure checked every 3-5 years. If you are 46 years of age or older, have your blood pressure checked every year. You should have your blood pressure measured twice--once when you are at a hospital or clinic, and once when you are not at a hospital or clinic. Record the average of the two measurements. To check your blood pressure when you are not at a hospital or clinic, you can use:  An automated blood pressure machine at a pharmacy.  A home blood pressure monitor.  If you are between 30 years and 27 years old, ask your health care provider if you should take aspirin to prevent strokes.  Have regular diabetes screenings. This involves taking a blood sample to check your fasting blood sugar level.  If you are at a normal weight and have a low risk for diabetes, have this test once every three years after 50 years of age.  If you are overweight and have a high risk for diabetes, consider being tested at a younger age or more often. PREVENTING INFECTION  Hepatitis B  If you have a higher risk for hepatitis B, you should be screened for this virus. You are considered at high risk for hepatitis B if:  You were born in a country where hepatitis B is common. Ask your health care provider which countries are considered high risk.  Your parents were born in a high-risk country, and you have not been immunized against hepatitis B (hepatitis B  vaccine).  You have HIV or AIDS.  You use needles to inject street drugs.  You live with someone who has hepatitis B.  You have had sex with someone who has hepatitis B.  You get hemodialysis treatment.  You take certain medicines for conditions, including cancer, organ transplantation, and autoimmune conditions. Hepatitis C  Blood testing is recommended for:  Everyone born from [birth year] through 1966.  Anyone with known risk factors for hepatitis C. Sexually transmitted infections (STIs)  You should be screened for sexually transmitted infections (STIs) including gonorrhea and chlamydia if:  You are sexually active and are younger than 49 years of age.  You are older than 50 years of age and your health care provider tells you that you are at risk for this type of infection.  Your sexual activity has changed since you were last screened and you are at an increased risk for chlamydia or gonorrhea. Ask your health care provider if you are at risk.  If you do not have HIV, but are at risk, it may be recommended that you take a prescription medicine daily to prevent  HIV infection. This is called pre-exposure prophylaxis (PrEP). You are considered at risk if:  You are sexually active and do not regularly use condoms or know the HIV status of your partner(s).  You take drugs by injection.  You are sexually active with a partner who has HIV. Talk with your health care provider about whether you are at high risk of being infected with HIV. If you choose to begin PrEP, you should first be tested for HIV. You should then be tested every 3 months for as long as you are taking PrEP.  PREGNANCY   If you are premenopausal and you may become pregnant, ask your health care provider about preconception counseling.  If you may become pregnant, take 400 to 800 micrograms (mcg) of folic acid every day.  If you want to prevent pregnancy, talk to your health care provider about birth control  (contraception). OSTEOPOROSIS AND MENOPAUSE   Osteoporosis is a disease in which the bones lose minerals and strength with aging. This can result in serious bone fractures. Your risk for osteoporosis can be identified using a bone density scan.  If you are 32 years of age or older, or if you are at risk for osteoporosis and fractures, ask your health care provider if you should be screened.  Ask your health care provider whether you should take a calcium or vitamin D supplement to lower your risk for osteoporosis.  Menopause may have certain physical symptoms and risks.  Hormone replacement therapy may reduce some of these symptoms and risks. Talk to your health care provider about whether hormone replacement therapy is right for you.  HOME CARE INSTRUCTIONS   Schedule regular health, dental, and eye exams.  Stay current with your immunizations.   Do not use any tobacco products including cigarettes, chewing tobacco, or electronic cigarettes.  If you are pregnant, do not drink alcohol.  If you are breastfeeding, limit how much and how often you drink alcohol.  Limit alcohol intake to no more than 1 drink per day for nonpregnant women. One drink equals 12 ounces of beer, 5 ounces of wine, or 1 ounces of hard liquor.  Do not use street drugs.  Do not share needles.  Ask your health care provider for help if you need support or information about quitting drugs.  Tell your health care provider if you often feel depressed.  Tell your health care provider if you have ever been abused or do not feel safe at home.   This information is not intended to replace advice given to you by your health care provider. Make sure you discuss any questions you have with your health care provider.   Document Released: 11/25/2010 Document Revised: 06/02/2014 Document Reviewed: 04/13/2013 Elsevier Interactive Patient Education 2016 [place] K. Ioan Landini M.D.

## 2015-03-20 NOTE — Patient Instructions (Signed)
Will notify you when pap results are available.  Sent in cortisone inhaler for lungs   To take once a day to see if helps suppress the cough wheezing . considier chest x ray if not done in the last year or so. Agree withe allergy evaluation Will do colonoscopy referral .   Health Maintenance, Female Adopting a healthy lifestyle and getting preventive care can go a long way to promote health and wellness. Talk with your health care provider about what schedule of regular examinations is right for you. This is a good chance for you to check in with your provider about disease prevention and staying healthy. In between checkups, there are plenty of things you can do on your own. Experts have done a lot of research about which lifestyle changes and preventive measures are most likely to keep you healthy. Ask your health care provider for more information. WEIGHT AND DIET  Eat a healthy diet  Be sure to include plenty of vegetables, fruits, low-fat dairy products, and lean protein.  Do not eat a lot of foods high in solid fats, added sugars, or salt.  Get regular exercise. This is one of the most important things you can do for your health.  Most adults should exercise for at least 150 minutes each week. The exercise should increase your heart rate and make you sweat (moderate-intensity exercise).  Most adults should also do strengthening exercises at least twice a week. This is in addition to the moderate-intensity exercise.  Maintain a healthy weight  Body mass index (BMI) is a measurement that can be used to identify possible weight problems. It estimates body fat based on height and weight. Your health care provider can help determine your BMI and help you achieve or maintain a healthy weight.  For females 64 years of age and older:   A BMI below 18.5 is considered underweight.  A BMI of 18.5 to 24.9 is normal.  A BMI of 25 to 29.9 is considered overweight.  A BMI of 30 and above  is considered obese.  Watch levels of cholesterol and blood lipids  You should start having your blood tested for lipids and cholesterol at 50 years of age, then have this test every 5 years.  You may need to have your cholesterol levels checked more often if:  Your lipid or cholesterol levels are high.  You are older than 50 years of age.  You are at high risk for heart disease.  CANCER SCREENING   Lung Cancer  Lung cancer screening is recommended for adults 58-70 years old who are at high risk for lung cancer because of a history of smoking.  A yearly low-dose CT scan of the lungs is recommended for people who:  Currently smoke.  Have quit within the past 15 years.  Have at least a 30-pack-year history of smoking. A pack year is smoking an average of one pack of cigarettes a day for 1 year.  Yearly screening should continue until it has been 15 years since you quit.  Yearly screening should stop if you develop a health problem that would prevent you from having lung cancer treatment.  Breast Cancer  Practice breast self-awareness. This means understanding how your breasts normally appear and feel.  It also means doing regular breast self-exams. Let your health care provider know about any changes, no matter how small.  If you are in your 20s or 30s, you should have a clinical breast exam (CBE) by a health  care provider every 1-3 years as part of a regular health exam.  If you are 34 or older, have a CBE every year. Also consider having a breast X-ray (mammogram) every year.  If you have a family history of breast cancer, talk to your health care provider about genetic screening.  If you are at high risk for breast cancer, talk to your health care provider about having an MRI and a mammogram every year.  Breast cancer gene (BRCA) assessment is recommended for women who have family members with BRCA-related cancers. BRCA-related cancers  include:  Breast.  Ovarian.  Tubal.  Peritoneal cancers.  Results of the assessment will determine the need for genetic counseling and BRCA1 and BRCA2 testing. Cervical Cancer Your health care provider may recommend that you be screened regularly for cancer of the pelvic organs (ovaries, uterus, and vagina). This screening involves a pelvic examination, including checking for microscopic changes to the surface of your cervix (Pap test). You may be encouraged to have this screening done every 3 years, beginning at age 21.  For women ages 89-65, health care providers may recommend pelvic exams and Pap testing every 3 years, or they may recommend the Pap and pelvic exam, combined with testing for human papilloma virus (HPV), every 5 years. Some types of HPV increase your risk of cervical cancer. Testing for HPV may also be done on women of any age with unclear Pap test results.  Other health care providers may not recommend any screening for nonpregnant women who are considered low risk for pelvic cancer and who do not have symptoms. Ask your health care provider if a screening pelvic exam is right for you.  If you have had past treatment for cervical cancer or a condition that could lead to cancer, you need Pap tests and screening for cancer for at least 20 years after your treatment. If Pap tests have been discontinued, your risk factors (such as having a new sexual partner) need to be reassessed to determine if screening should resume. Some women have medical problems that increase the chance of getting cervical cancer. In these cases, your health care provider may recommend more frequent screening and Pap tests. Colorectal Cancer  This type of cancer can be detected and often prevented.  Routine colorectal cancer screening usually begins at 50 years of age and continues through 50 years of age.  Your health care provider may recommend screening at an earlier age if you have risk factors for  colon cancer.  Your health care provider may also recommend using home test kits to check for hidden blood in the stool.  A small camera at the end of a tube can be used to examine your colon directly (sigmoidoscopy or colonoscopy). This is done to check for the earliest forms of colorectal cancer.  Routine screening usually begins at age 36.  Direct examination of the colon should be repeated every 5-10 years through 50 years of age. However, you may need to be screened more often if early forms of precancerous polyps or small growths are found. Skin Cancer  Check your skin from head to toe regularly.  Tell your health care provider about any new moles or changes in moles, especially if there is a change in a mole's shape or color.  Also tell your health care provider if you have a mole that is larger than the size of a pencil eraser.  Always use sunscreen. Apply sunscreen liberally and repeatedly throughout the day.  Protect  yourself by wearing long sleeves, pants, a wide-brimmed hat, and sunglasses whenever you are outside. HEART DISEASE, DIABETES, AND HIGH BLOOD PRESSURE   High blood pressure causes heart disease and increases the risk of stroke. High blood pressure is more likely to develop in:  People who have blood pressure in the high end of the normal range (130-139/85-89 mm Hg).  People who are overweight or obese.  People who are African American.  If you are 54-65 years of age, have your blood pressure checked every 3-5 years. If you are 81 years of age or older, have your blood pressure checked every year. You should have your blood pressure measured twice--once when you are at a hospital or clinic, and once when you are not at a hospital or clinic. Record the average of the two measurements. To check your blood pressure when you are not at a hospital or clinic, you can use:  An automated blood pressure machine at a pharmacy.  A home blood pressure monitor.  If you  are between 81 years and 18 years old, ask your health care provider if you should take aspirin to prevent strokes.  Have regular diabetes screenings. This involves taking a blood sample to check your fasting blood sugar level.  If you are at a normal weight and have a low risk for diabetes, have this test once every three years after 50 years of age.  If you are overweight and have a high risk for diabetes, consider being tested at a younger age or more often. PREVENTING INFECTION  Hepatitis B  If you have a higher risk for hepatitis B, you should be screened for this virus. You are considered at high risk for hepatitis B if:  You were born in a country where hepatitis B is common. Ask your health care provider which countries are considered high risk.  Your parents were born in a high-risk country, and you have not been immunized against hepatitis B (hepatitis B vaccine).  You have HIV or AIDS.  You use needles to inject street drugs.  You live with someone who has hepatitis B.  You have had sex with someone who has hepatitis B.  You get hemodialysis treatment.  You take certain medicines for conditions, including cancer, organ transplantation, and autoimmune conditions. Hepatitis C  Blood testing is recommended for:  Everyone born from [birth year] through 1966.  Anyone with known risk factors for hepatitis C. Sexually transmitted infections (STIs)  You should be screened for sexually transmitted infections (STIs) including gonorrhea and chlamydia if:  You are sexually active and are younger than 51 years of age.  You are older than 50 years of age and your health care provider tells you that you are at risk for this type of infection.  Your sexual activity has changed since you were last screened and you are at an increased risk for chlamydia or gonorrhea. Ask your health care provider if you are at risk.  If you do not have HIV, but are at risk, it may be recommended that you  take a prescription medicine daily to prevent HIV infection. This is called pre-exposure prophylaxis (PrEP). You are considered at risk if:  You are sexually active and do not regularly use condoms or know the HIV status of your partner(s).  You take drugs by injection.  You are sexually active with a partner who has HIV. Talk with your health care provider about whether you are at high risk of being infected with  HIV. If you choose to begin PrEP, you should first be tested for HIV. You should then be tested every 3 months for as long as you are taking PrEP.  PREGNANCY   If you are premenopausal and you may become pregnant, ask your health care provider about preconception counseling.  If you may become pregnant, take 400 to 800 micrograms (mcg) of folic acid every day.  If you want to prevent pregnancy, talk to your health care provider about birth control (contraception). OSTEOPOROSIS AND MENOPAUSE   Osteoporosis is a disease in which the bones lose minerals and strength with aging. This can result in serious bone fractures. Your risk for osteoporosis can be identified using a bone density scan.  If you are 53 years of age or older, or if you are at risk for osteoporosis and fractures, ask your health care provider if you should be screened.  Ask your health care provider whether you should take a calcium or vitamin D supplement to lower your risk for osteoporosis.  Menopause may have certain physical symptoms and risks.  Hormone replacement therapy may reduce some of these symptoms and risks. Talk to your health care provider about whether hormone replacement therapy is right for you.  HOME CARE INSTRUCTIONS   Schedule regular health, dental, and eye exams.  Stay current with your immunizations.   Do not use any tobacco products including cigarettes, chewing tobacco, or electronic cigarettes.  If you are pregnant, do not drink alcohol.  If you are breastfeeding, limit how  much and how often you drink alcohol.  Limit alcohol intake to no more than 1 drink per day for nonpregnant women. One drink equals 12 ounces of beer, 5 ounces of wine, or 1 ounces of hard liquor.  Do not use street drugs.  Do not share needles.  Ask your health care provider for help if you need support or information about quitting drugs.  Tell your health care provider if you often feel depressed.  Tell your health care provider if you have ever been abused or do not feel safe at home.   This information is not intended to replace advice given to you by your health care provider. Make sure you discuss any questions you have with your health care provider.   Document Released: 11/25/2010 Document Revised: 06/02/2014 Document Reviewed: 04/13/2013 Elsevier Interactive Patient Education [employer].

## 2015-03-22 LAB — CYTOLOGY - PAP

## 2015-03-26 ENCOUNTER — Ambulatory Visit (INDEPENDENT_AMBULATORY_CARE_PROVIDER_SITE_OTHER)
Admission: RE | Admit: 2015-03-26 | Discharge: 2015-03-26 | Disposition: A | Discharge status: Home / Self Care | Payer: BLUE CROSS/BLUE SHIELD | Source: Ambulatory Visit | Attending: Internal Medicine | Admitting: Internal Medicine

## 2015-03-26 ENCOUNTER — Telehealth: Payer: Self-pay | Admitting: Internal Medicine

## 2015-03-26 DIAGNOSIS — J45990 Exercise induced bronchospasm: Secondary | ICD-10-CM

## 2015-03-26 DIAGNOSIS — R062 Wheezing: Secondary | ICD-10-CM | POA: Diagnosis not present

## 2015-03-26 NOTE — Telephone Encounter (Signed)
Dr. Ardis Hughs will you accept this pt?

## 2015-03-26 NOTE — Telephone Encounter (Signed)
I'm happy to assume her care.  Please scheduled for my next available ROV. Do not double book and do not book with an extender.  Thanks

## 2015-03-27 NOTE — Telephone Encounter (Signed)
Appt has been made for 06/04/15 pt is aware

## 2015-06-04 ENCOUNTER — Ambulatory Visit: Payer: BLUE CROSS/BLUE SHIELD | Admitting: Gastroenterology

## 2015-07-25 ENCOUNTER — Other Ambulatory Visit: Payer: Self-pay

## 2015-07-25 DIAGNOSIS — Z1231 Encounter for screening mammogram for malignant neoplasm of breast: Secondary | ICD-10-CM

## 2015-08-01 ENCOUNTER — Ambulatory Visit (INDEPENDENT_AMBULATORY_CARE_PROVIDER_SITE_OTHER): Payer: BLUE CROSS/BLUE SHIELD | Admitting: Gastroenterology

## 2015-08-01 ENCOUNTER — Encounter: Payer: Self-pay | Admitting: Gastroenterology

## 2015-08-01 VITALS — BP 88/60 | HR 80 | Ht 63.5 in | Wt 148.2 lb

## 2015-08-01 DIAGNOSIS — Z1211 Encounter for screening for malignant neoplasm of colon: Secondary | ICD-10-CM

## 2015-08-01 MED ORDER — NA SULFATE-K SULFATE-MG SULF 17.5-3.13-1.6 GM/177ML PO SOLN: 1.0000 | Freq: Once | ORAL | Status: DC

## 2015-08-01 NOTE — Progress Notes (Signed)
Review of pertinent gastrointestinal problems: 1. Anal fissure, noted by Dr. Olevia Cunningham 2009; colonoscopy  was normal except for anal fissure. Office visit 2016 with Dr. Olevia Cunningham for rectal bleeding, she was scheduled to have repeat colonoscopy at that time but the procedure never happened. 2. Dypshagia, dypepsia. EGD Dr. Olevia Cunningham 2009 Findings - Normal: Distal Esophagus. Comments: small island of gastric mucosa at thr g-e junction, no stricture, no erosions.  - Dilation: Duodenal Bulb. Maloney dilator used, Diameter: 48 mm, Minimal Resistance, Minimal Heme present on extraction. 1 total dilators used. Patient tolerance excellent. Outcome: successful.  - Normal: Duodenal Bulb to Duodenal 2nd Portion.  Normal: Body.  Pathology showed no Barrett's.  Dr. Olevia Cunningham documented that she dilated the duodenal bulb, see above.  I don't think that is correct, I suspect she dilated GE junction (however no stricture was documented at GE junction).  Not really sure what she did during that procedure.   HPI: This is a     very pleasant 50 year old woman whom I am meeting for the first time. She was a previous patient of Dr. Elicia Cunningham.  Chief complaint is chronic constipation  No rectal bleeding in a long time.  She takes colace two a day with laxatives, for 8-9 years.  She has no changes at in her bowel.  NO antiacid medicines.  When she swallows things move slowly.  Overall stable weight.  Mother had terrible constipation, some type of surgery.   Past Medical History  Diagnosis Date  . Bronchiectasis     ill from childhood pneumonia? had broch in about 1999, pft's WNL 04/12/09  . Myalgia and myositis, unspecified 09/2010    Panosh  . Nontoxic multinodular goiter 07/03/10    Lori Cunningham  . GERD (gastroesophageal reflux disease)   . DYSPNEA 02/12/2009  . Hemorrhage of rectum and anus 10/06/2007  . PALPITATIONS, RECURRENT 03/22/2007  . PREMATURE VENTRICULAR CONTRACTIONS 05/21/2010  . Anal fissure 12/03/2007  .  Headache(784.0) 10/16/2008  . Eczema   . Hypothyroidism   . Depression   . Periorbital swelling with pruritis 09/17/2012    no dx  prob allergic but context and hx  noevidence of infection    . Sleep disturbance   . Plantar fasciitis   . Allergic rhinitis   . HPV test positive     Past Surgical History  Procedure Laterality Date  . Carpal tunnel release Bilateral     Sypher  . Cesarean section      1994 and 2000  . Nasal turbinate reduction      deviated septum  . Bronchoscopy      in 1980's bronchiesctesis III Dr. Consuelo Cunningham    Current Outpatient Prescriptions  Medication Sig Dispense Refill  . Albuterol Sulfate (PROAIR RESPICLICK) 123XX123 (90 BASE) MCG/ACT AEPB Inhale 2 Act into the lungs as directed. Pre exercise 15- 30 minutes 1 each 2  . docusate sodium (COLACE) 100 MG capsule Take by mouth. Take 2 by mouth at bedtime     . estradiol (VIVELLE-DOT) 0.05 MG/24HR patch   0  . loratadine (LORATADINE ALLERGY RELIEF) 10 MG dissolvable tablet Take 10 mg by mouth daily.    . mometasone (ASMANEX 60 METERED DOSES) 220 MCG/INH inhaler Inhale 1 puff into the lungs daily. 1 Inhaler 3  . montelukast (SINGULAIR) 10 MG tablet Take 10 mg by mouth at bedtime.    Marland Kitchen NATURE-THROID 195 MG tablet   1  . NONFORMULARY OR COMPOUNDED ITEM 3 application daily. Estriol/testosterone HRT 0.6-2 mg/GM    .  Progesterone Micronized (PROGESTERONE PO) Take 150 mg on nights 7 thru 14 of cycle and 300 mg on nights 15 thru 28     No current facility-administered medications for this visit.    Allergies as of 08/01/2015 - Review Complete 08/01/2015  Allergen Reaction Noted  . Trazodone and nefazodone Palpitations 09/21/2013    Family History  Problem Relation Age of Onset  . Diabetes Mother   . Hyperlipidemia Mother   . Other Father     sleep walking  . Aneurysm Paternal Uncle     AAA  . Colon cancer Neg Hx     Social History   Social History  . Marital Status: Divorced    Spouse Name: N/A  . Number  of Children: 2  . Years of Education: N/A   Occupational History  . self employed    Social History Main Topics  . Smoking status: Former Smoker    Types: Cigarettes  . Smokeless tobacco: Never Used  . Alcohol Use: 1.2 oz/week    2 Glasses of wine per week  . Drug Use: No  . Sexual Activity: Not on file   Other Topics Concern  . Not on file   Social History Narrative   Divorced works in [place] going back to school   2 daughters   Former smoker quit in 1985 smoked half a pack per day for 2 years   Social alcohol red wine   Household of 3   Minimal caffeine     Physical Exam: BP 88/60 mmHg  Pulse 80  Ht 5' 3.5" (1.613 m)  Wt 148 lb 4 oz (67.246 kg)  BMI 25.85 kg/m2  LMP 09/23/2012 Constitutional: generally well-appearing Psychiatric: alert and oriented x3 Abdomen: soft, nontender, nondistended, no obvious ascites, no peritoneal signs, normal bowel sounds   Assessment and plan: 50 y.o. female with Chronic constipation, otherwise routine risk for colon cancer  She has had no changes in her bowel habits or her upper GI symptoms since last procedures 2009 Dr. Olevia Cunningham. She is 50 years old and I did recommend we proceed with colon cancer screening with colonoscopy, her last colonoscopy was 8 years ago now. She takes a laxative daily and understands that chronic laxative use can cause slowly worsening: Function over time. This is a regimen she has been on since she first started with Dr. Olevia Cunningham 8 years ago and she is very happy with it does not sound like she wants to stop.   Lori Loffler, MD [facility] 08/01/2015, 3:40 PM

## 2015-08-01 NOTE — Patient Instructions (Signed)
You will be set up for a colonoscopy for screening.

## 2015-08-14 ENCOUNTER — Ambulatory Visit
Admission: RE | Admit: 2015-08-14 | Discharge: 2015-08-14 | Disposition: A | Discharge status: Home / Self Care | Payer: BLUE CROSS/BLUE SHIELD | Source: Ambulatory Visit

## 2015-08-14 DIAGNOSIS — Z1231 Encounter for screening mammogram for malignant neoplasm of breast: Secondary | ICD-10-CM

## 2015-09-24 ENCOUNTER — Encounter: Payer: BLUE CROSS/BLUE SHIELD | Admitting: Gastroenterology

## 2015-10-08 ENCOUNTER — Encounter: Payer: BLUE CROSS/BLUE SHIELD | Admitting: Gastroenterology

## 2015-10-30 ENCOUNTER — Encounter: Payer: Self-pay | Admitting: Gastroenterology

## 2015-11-06 ENCOUNTER — Telehealth: Payer: Self-pay | Admitting: Gastroenterology

## 2015-11-06 DIAGNOSIS — Z1211 Encounter for screening for malignant neoplasm of colon: Secondary | ICD-10-CM

## 2015-11-06 MED ORDER — NA SULFATE-K SULFATE-MG SULF 17.5-3.13-1.6 GM/177ML PO SOLN: 1.0000 | Freq: Once | ORAL | Status: DC

## 2015-11-06 NOTE — Telephone Encounter (Signed)
rx sent

## 2015-11-07 ENCOUNTER — Ambulatory Visit (INDEPENDENT_AMBULATORY_CARE_PROVIDER_SITE_OTHER): Payer: BLUE CROSS/BLUE SHIELD | Admitting: Internal Medicine

## 2015-11-07 ENCOUNTER — Encounter: Payer: Self-pay | Admitting: Internal Medicine

## 2015-11-07 VITALS — BP 114/72 | Temp 98.0°F | Wt 156.9 lb

## 2015-11-07 DIAGNOSIS — M545 Low back pain, unspecified: Secondary | ICD-10-CM

## 2015-11-07 DIAGNOSIS — S0093XA Contusion of unspecified part of head, initial encounter: Secondary | ICD-10-CM

## 2015-11-07 DIAGNOSIS — S0990XA Unspecified injury of head, initial encounter: Secondary | ICD-10-CM | POA: Diagnosis not present

## 2015-11-07 DIAGNOSIS — M79604 Pain in right leg: Secondary | ICD-10-CM

## 2015-11-07 NOTE — Progress Notes (Signed)
Pre visit review using our clinic review tool, if applicable. No additional management support is needed unless otherwise documented below in the visit note. 

## 2015-11-07 NOTE — Patient Instructions (Addendum)
The back pain  Continue  Activity as tolerated  Avoid sitting    Aleve 2.5 twice a day is ok .   Will arrange  referral to Sports medicine.   also ct scan  But fortunately  Done see  Fracture   On exam today . Continue cold  And  Fall prevention may take a while to resolve and lump to go down.

## 2015-11-07 NOTE — Progress Notes (Signed)
Chief Complaint  Patient presents with  . Fall    Fell 1.5 wks ago.  Lump on left temperal.  Sore to touch.  Size is decreasing.  Also has lower back pain ongoing for 3 weeks.  Radiating down both legs.  Rt leg is worse.  Treating with naproxen.  . Back Pain  . Mass  . Bruising    HPI: Lori Cunningham 50 y.o.   Comes in for acute problem 2.  1-1/2 weeks ago was pulling her luggage when traveling and tripped onto her luggage but her left forehead head side. The floor concrete very hard. No loss of consciousness but pain and very large swelling of her left brow and lateral temporal area. She used ice came home tonight any double vision neurologic signs. Had a softball size lump that area. However it still remains high there is some tenderness over the area and bruising all over her face. Her vision is okay for large swollen lid were difficult to see no weakness vomiting ear symptoms.  Second problem is more problematic 3-4 weeks ago onset of right lower back SI area pain that often radiates down her right leg. No associated weakness is uncomfortable standing up or walking is easier. No specific injury related. Has been using Aleve about 3 a day to keep the pain tolerable. Does not feel the above injury was anyway related to her back. No fever UTI symptoms. 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  ROS: See pertinent positives and negatives per HPI. No fever nv juti sx   Past Medical History  Diagnosis Date  . Bronchiectasis     ill from childhood pneumonia? had broch in about 1999, pft's WNL 04/12/09  . Myalgia and myositis, unspecified 09/2010    Charish Schroepfer  . Nontoxic multinodular goiter 07/03/10    [name]  . GERD (gastroesophageal reflux disease)   . DYSPNEA 02/12/2009  . Hemorrhage of rectum and anus 10/06/2007  . PALPITATIONS, RECURRENT 03/22/2007  . PREMATURE VENTRICULAR CONTRACTIONS 05/21/2010  . Anal fissure 12/03/2007  . Headache(784.0)  10/16/2008  . Eczema   . Hypothyroidism   . Depression   . Periorbital swelling with pruritis 09/17/2012    no dx  prob allergic but context and hx  noevidence of infection    . Sleep disturbance   . Plantar fasciitis   . Allergic rhinitis   . HPV test positive     Family History  Problem Relation Age of Onset  . Diabetes Mother   . Hyperlipidemia Mother   . Other Father     sleep walking  . Aneurysm Paternal Uncle     AAA  . Colon cancer Neg Hx     Social History   Social History  . Marital Status: Divorced    Spouse Name: N/A  . Number of Children: 2  . Years of Education: N/A   Occupational History  . self employed    Social History Main Topics  . Smoking status: Former Smoker    Types: Cigarettes  . Smokeless tobacco: Never Used  . Alcohol Use: 1.2 oz/week    2 Glasses of wine per week  . Drug Use: No  . Sexual Activity: Not on file   Other Topics Concern  . Not on file   Social History Narrative   Divorced works in [place] going back to school   2 daughters   Former smoker quit in 1985 smoked half a pack per day  for 2 years   Social alcohol red wine   Household of 3   Minimal caffeine    Outpatient Prescriptions Prior to Visit  Medication Sig Dispense Refill  . Albuterol Sulfate (PROAIR RESPICLICK) 741 (90 BASE) MCG/ACT AEPB Inhale 2 Act into the lungs as directed. Pre exercise 15- 30 minutes 1 each 2  . docusate sodium (COLACE) 100 MG capsule Take by mouth. Take 2 by mouth at bedtime     . estradiol (VIVELLE-DOT) 0.05 MG/24HR patch   0  . loratadine (LORATADINE ALLERGY RELIEF) 10 MG dissolvable tablet Take 10 mg by mouth daily.    . mometasone (ASMANEX 60 METERED DOSES) 220 MCG/INH inhaler Inhale 1 puff into the lungs daily. 1 Inhaler 3  . montelukast (SINGULAIR) 10 MG tablet Take 10 mg by mouth at bedtime.    . Na Sulfate-K Sulfate-Mg Sulf 17.5-3.13-1.6 GM/180ML SOLN Take 1 kit by mouth once. 354 mL 0  . NATURE-THROID 195 MG tablet    1  . NONFORMULARY OR COMPOUNDED ITEM 3 application daily. Estriol/testosterone HRT 0.6-2 mg/GM    . Progesterone Micronized (PROGESTERONE PO) Take 150 mg on nights 7 thru 14 of cycle and 300 mg on nights 15 thru 28     No facility-administered medications prior to visit.     EXAM:  BP 114/72 mmHg  Temp(Src) 98 F (36.7 C) (Oral)  Wt 156 lb 14.4 oz (71.169 kg)  LMP 09/23/2012  Body mass index is 27.35 kg/(m^2).  GENERAL: vitals reviewed and listed above, alert, oriented, appears well hydrated and in no acute distress She has a large amount of bruising on the left side of her face beginning with a 4 cm hematoma on the left forehead lateral eyebrow area dependent bruising around the left a raccoon style and into the cheek. Some of it is yellowing. Tenderness at the hematoma no obvious bony deformity no significant tenderness around her orbital rim. HEENT: , conjunctiva  clear, no obvious abnormalities on inspection of external nose and ears tms cler  OP : no lesion edema or exudate tongue symmetrical as is face   NECK: no obvious masses on inspection palpation  t CV: HRRR, no clubbing cyanosis or  peripheral edema nl cap refill  MS: moves all extremities without noticeable focal  Abnormality has some mild tenderness right SI area walks without weakness some discomfort is very uncomfortable sitting down negative SLR DTRs are present and equal. Toe heel walk shows no weakness. PSYCH: pleasant and cooperative, no obvious depression or anxiety  ASSESSMENT AND PLAN:  Discussed the following assessment and plan:  Head injury, initial encounter - Plan: CT Head Wo Contrast  Traumatic hematoma of head, initial encounter - Plan: CT Head Wo Contrast  Low back pain radiating to right leg - Plan: Ambulatory referral to Sports Medicine Discussed the risk-benefit of imaging. Doesn't have specific signs of a concussion which is fortunate. Probably no fracture but can do CT scan of head to check this  out. Continued cold avoidance expectant management no obvious infection at this point. Her back issues more problematic fortunately no alarm symptoms but it is been going on for 3-4 weeks affecting her ability to work and get things done. She is taking round-the-clock Aleve. Discussed risk-benefit maximum dosing. Refer to sports medicine for physical modalities. -Patient advised to return or notify health care team  if symptoms worsen ,persist or new concerns arise.  Patient Instructions  The back pain  Continue  Activity as tolerated  Avoid sitting  Aleve 2.5 twice a day is ok .   Will arrange  referral to Sports medicine.   also ct scan  But fortunately  Done see  Fracture   On exam today . Continue cold  And  Fall prevention may take a while to resolve and lump to go down.        Standley Brooking. Deyanna Mctier M.D.

## 2015-11-08 ENCOUNTER — Encounter: Payer: Self-pay | Admitting: *Deleted

## 2015-11-08 ENCOUNTER — Ambulatory Visit (INDEPENDENT_AMBULATORY_CARE_PROVIDER_SITE_OTHER)
Admission: RE | Admit: 2015-11-08 | Discharge: 2015-11-08 | Disposition: A | Discharge status: Home / Self Care | Payer: BLUE CROSS/BLUE SHIELD | Source: Ambulatory Visit | Attending: Internal Medicine | Admitting: Internal Medicine

## 2015-11-08 DIAGNOSIS — S0990XA Unspecified injury of head, initial encounter: Secondary | ICD-10-CM

## 2015-11-08 DIAGNOSIS — S0093XA Contusion of unspecified part of head, initial encounter: Secondary | ICD-10-CM | POA: Diagnosis not present

## 2015-11-12 ENCOUNTER — Encounter: Payer: Self-pay | Admitting: Gastroenterology

## 2015-11-12 ENCOUNTER — Ambulatory Visit (AMBULATORY_SURGERY_CENTER): Payer: BLUE CROSS/BLUE SHIELD | Admitting: Gastroenterology

## 2015-11-12 VITALS — BP 116/62 | HR 61 | Temp 98.9°F | Resp 12 | Ht 63.7 in | Wt 156.0 lb

## 2015-11-12 DIAGNOSIS — Z1211 Encounter for screening for malignant neoplasm of colon: Secondary | ICD-10-CM | POA: Diagnosis not present

## 2015-11-12 MED ORDER — SODIUM CHLORIDE 0.9 % IV SOLN: 500.0000 mL | INTRAVENOUS | Status: DC

## 2015-11-12 NOTE — Progress Notes (Signed)
A/ox3, pleased with MAC, report to RN 

## 2015-11-12 NOTE — Patient Instructions (Signed)
Discharge instructions given. Normal exam. Resume previous medications. YOU HAD AN ENDOSCOPIC PROCEDURE TODAY AT THE [facility]:   Refer to the procedure report that was given to you for any specific questions about what was found during the examination.  If the procedure report does not answer your questions, please call your gastroenterologist to clarify.  If you requested that your care partner not be given the details of your procedure findings, then the procedure report has been included in a sealed envelope for you to review at your convenience later.  YOU SHOULD EXPECT: Some feelings of bloating in the abdomen. Passage of more gas than usual.  Walking can help get rid of the air that was put into your GI tract during the procedure and reduce the bloating. If you had a lower endoscopy (such as a colonoscopy or flexible sigmoidoscopy) you may notice spotting of blood in your stool or on the toilet paper. If you underwent a bowel prep for your procedure, you may not have a normal bowel movement for a few days.  Please Note:  You might notice some irritation and congestion in your nose or some drainage.  This is from the oxygen used during your procedure.  There is no need for concern and it should clear up in a day or so.  SYMPTOMS TO REPORT IMMEDIATELY:   Following lower endoscopy (colonoscopy or flexible sigmoidoscopy):  Excessive amounts of blood in the stool  Significant tenderness or worsening of abdominal pains  Swelling of the abdomen that is new, acute  Fever of 100F or higher   For urgent or emergent issues, a gastroenterologist can be reached at any hour by calling [phone].   DIET: Your first meal following the procedure should be a small meal and then it is ok to progress to your normal diet. Heavy or fried foods are harder to digest and may make you feel nauseous or bloated.  Likewise, meals heavy in dairy and vegetables can increase bloating.  Drink plenty  of fluids but you should avoid alcoholic beverages for 24 hours.  ACTIVITY:  You should plan to take it easy for the rest of today and you should NOT DRIVE or use heavy machinery until tomorrow (because of the sedation medicines used during the test).    FOLLOW UP: Our staff will call the number listed on your records the next business day following your procedure to check on you and address any questions or concerns that you may have regarding the information given to you following your procedure. If we do not reach you, we will leave a message.  However, if you are feeling well and you are not experiencing any problems, there is no need to return our call.  We will assume that you have returned to your regular daily activities without incident.  If any biopsies were taken you will be contacted by phone or by letter within the next 1-3 weeks.  Please call us at [phone] if you have not heard about the biopsies in 3 weeks.    SIGNATURES/CONFIDENTIALITY: You and/or your care partner have signed paperwork which will be entered into your electronic medical record.  These signatures attest to the fact that that the information above on your After Visit Summary has been reviewed and is understood.  Full responsibility of the confidentiality of this discharge information lies with you and/or your care-partner. 

## 2015-11-13 ENCOUNTER — Telehealth: Payer: Self-pay

## 2015-11-13 NOTE — Op Note (Addendum)
[facility] Patient Name: Lori Cunningham Procedure Date: 11/12/2015 2:17 PM MRN: [id number] Endoscopist: Milus Banister , MD Age: 50 Referring MD:  Date of Birth: Sep 30, 1965 Gender: Female Account #: [id number] Procedure:                Colonoscopy Indications:              Screening for colorectal malignant neoplasm Medicines:                Monitored Anesthesia Care Procedure:                Pre-Anesthesia Assessment:                           - Prior to the procedure, a History and Physical                            was performed, and patient medications and                            allergies were reviewed. The patient's tolerance of                            previous anesthesia was also reviewed. The risks                            and benefits of the procedure and the sedation                            options and risks were discussed with the patient.                            All questions were answered, and informed consent                            was obtained. Prior Anticoagulants: The patient has                            taken no previous anticoagulant or antiplatelet                            agents. ASA Grade Assessment: II - A patient with                            mild systemic disease. After reviewing the risks                            and benefits, the patient was deemed in                            satisfactory condition to undergo the procedure.                           After obtaining informed consent, the colonoscope  was passed under direct vision. Throughout the                            procedure, the patient's blood pressure, pulse, and                            oxygen saturations were monitored continuously. The                            Model PCF-H190L [id number]) scope was introduced                            through the anus and advanced to the the cecum,                            identified by  appendiceal orifice and ileocecal                            valve. The colonoscopy was performed without                            difficulty. The patient tolerated the procedure                            well. The quality of the bowel preparation was                            good. The ileocecal valve, appendiceal orifice, and                            rectum were photographed. Scope In: 2:24:11 PM Scope Out: 2:33:15 PM Scope Withdrawal Time: 0 hours 5 minutes 42 seconds  Total Procedure Duration: 0 hours 9 minutes 4 seconds  Findings:                 The entire examined colon appeared normal on direct                            and retroflexion views. Complications:            No immediate complications. Estimated blood loss:                            None. Estimated Blood Loss:     Estimated blood loss: none. Impression:               - The entire examined colon is normal on direct and                            retroflexion views.                           - No specimens collected. Recommendation:           - Patient has a contact number available for  emergencies. The signs and symptoms of potential                            delayed complications were discussed with the                            patient. Return to normal activities tomorrow.                            Written discharge instructions were provided to the                            patient.                           - Resume previous diet.                           - Continue present medications.                           - Repeat colonoscopy in 10 years for screening                            purposes. There is no need for colon cancer                            screening by any method (including stool testing)                            prior to then. Milus Banister, MD 11/12/2015 2:35:47 PM This report has been signed electronically.

## 2015-11-13 NOTE — Telephone Encounter (Signed)
  Follow up Call-  Call back number 11/12/2015  Post procedure Call Back phone  # [phone]  Permission to leave phone message Yes    Patient was called for follow up after her procedure on 11/12/2015. No answer at the number given for follow up phone call. A message was left on the answering machine.

## 2016-01-08 ENCOUNTER — Telehealth: Payer: Self-pay | Admitting: Internal Medicine

## 2016-01-08 NOTE — Telephone Encounter (Signed)
Pt is return misty call °

## 2016-01-08 NOTE — Telephone Encounter (Signed)
Left a message for a return call.

## 2016-01-08 NOTE — Telephone Encounter (Signed)
Spoke to [place] and informed her of daughter's lab results.  See result note.  Haverly Smiddy.

## 2016-01-21 ENCOUNTER — Ambulatory Visit (INDEPENDENT_AMBULATORY_CARE_PROVIDER_SITE_OTHER): Payer: BLUE CROSS/BLUE SHIELD | Admitting: Family Medicine

## 2016-01-21 ENCOUNTER — Encounter: Payer: Self-pay | Admitting: Family Medicine

## 2016-01-21 VITALS — BP 108/80 | HR 100 | Temp 99.0°F | Ht 63.7 in | Wt 160.8 lb

## 2016-01-21 DIAGNOSIS — J029 Acute pharyngitis, unspecified: Secondary | ICD-10-CM

## 2016-01-21 LAB — POCT RAPID STREP A (OFFICE): Rapid Strep A Screen: NEGATIVE

## 2016-01-21 NOTE — Patient Instructions (Signed)
Continue antibiotic.  Aleve or ibuprofen per instructions as needed for pain. Gargling warm salt water and menthol cough drops can also help.  Follow up if worsening, new symptom or symptoms persist.  Pharyngitis Pharyngitis is redness, pain, and swelling (inflammation) of your pharynx.  CAUSES  Pharyngitis is usually caused by infection. Most of the time, these infections are from viruses (viral) and are part of a cold. However, sometimes pharyngitis is caused by bacteria (bacterial). Pharyngitis can also be caused by allergies. Viral pharyngitis may be spread from person to person by coughing, sneezing, and personal items or utensils (cups, forks, spoons, toothbrushes). Bacterial pharyngitis may be spread from person to person by more intimate contact, such as kissing.  SIGNS AND SYMPTOMS  Symptoms of pharyngitis include:   Sore throat.   Tiredness (fatigue).   Low-grade fever.   Headache.  Joint pain and muscle aches.  Skin rashes.  Swollen lymph nodes.  Plaque-like film on throat or tonsils (often seen with bacterial pharyngitis). DIAGNOSIS  Your health care provider will ask you questions about your illness and your symptoms. Your medical history, along with a physical exam, is often all that is needed to diagnose pharyngitis. Sometimes, a rapid strep test is done. Other lab tests may also be done, depending on the suspected cause.  TREATMENT  Viral pharyngitis will usually get better in 7-10 days without the use of medicine. Bacterial pharyngitis is treated with medicines that kill germs (antibiotics).  HOME CARE INSTRUCTIONS   Drink enough water and fluids to keep your urine clear or pale yellow.   Only take over-the-counter or prescription medicines as directed by your health care provider:   If you are prescribed antibiotics, make sure you finish them even if you start to feel better.   Do not take aspirin.   Get lots of rest.   Gargle with 8 oz of salt  water ( tsp of salt per 1 qt of water) as often as every 1-2 hours to soothe your throat.   Throat lozenges (if you are not at risk for choking) or sprays may be used to soothe your throat. SEEK MEDICAL CARE IF:   You have large, tender lumps in your neck.  You have a rash.  You cough up green, yellow-brown, or bloody spit. SEEK IMMEDIATE MEDICAL CARE IF:   Your neck becomes stiff.  You drool or are unable to swallow liquids.  You vomit or are unable to keep medicines or liquids down.  You have severe pain that does not go away with the use of recommended medicines.  You have trouble breathing (not caused by a stuffy nose). MAKE SURE YOU:   Understand these instructions.  Will watch your condition.  Will get help right away if you are not doing well or get worse.   This information is not intended to replace advice given to you by your health care provider. Make sure you discuss any questions you have with your health care provider.   Document Released: 05/12/2005 Document Revised: 03/02/2013 Document Reviewed: 01/17/2013 Elsevier Interactive Patient Education [employer].

## 2016-01-21 NOTE — Progress Notes (Signed)
Pre visit review using our clinic review tool, if applicable. No additional management support is needed unless otherwise documented below in the visit note. 

## 2016-01-21 NOTE — Progress Notes (Signed)
HPI:  Sore throat: -started: 4 days ago -symptoms:nasal congestion, sore throat, R ear full, PND, cough -denies:fever, SOB, NVD, tooth pain, abd pain -has tried: seen in urgent care and started on ceftin - rapid strep neg, but she wants retesting cause culture not done and she is not feeling better -sick contacts/travel/risks: no reported flu, strep or tick exposure -Hx of: allergies  ROS: See pertinent positives and negatives per HPI.  Past Medical History:  Diagnosis Date  . Allergic rhinitis   . Anal fissure 12/03/2007  . Bronchiectasis    ill from childhood pneumonia? had broch in about 1999, pft's WNL 04/12/09  . Depression    DENIES 11/12/2015  . DYSPNEA 02/12/2009  . Eczema   . GERD (gastroesophageal reflux disease)   . Headache(784.0) 10/16/2008  . Hemorrhage of rectum and anus 10/06/2007  . HPV test positive   . Hypothyroidism   . Myalgia and myositis, unspecified 09/2010   Panosh  . Nontoxic multinodular goiter 07/03/10   [name]  . PALPITATIONS, RECURRENT 03/22/2007  . Periorbital swelling with pruritis 09/17/2012   no dx  prob allergic but context and hx  noevidence of infection    . Plantar fasciitis   . PREMATURE VENTRICULAR CONTRACTIONS 05/21/2010  . Sleep disturbance     Past Surgical History:  Procedure Laterality Date  . BRONCHOSCOPY     in 1980's bronchiesctesis III Dr. Consuelo Pandy  . CARPAL TUNNEL RELEASE Bilateral    Sypher  . [place] and 2000  . COLONOSCOPY    . NASAL TURBINATE REDUCTION     deviated septum    Family History  Problem Relation Age of Onset  . Diabetes Mother   . Hyperlipidemia Mother   . Other Father     sleep walking  . Aneurysm Paternal Uncle     AAA  . Colon cancer Neg Hx   . Heart disease Maternal Grandfather     Social History   Social History  . Marital status: Divorced    Spouse name: N/A  . Number of children: 2  . Years of education: N/A   Occupational History  . self employed Sports administrator   Social History Main Topics  . Smoking status: Former Smoker    Types: Cigarettes  . Smokeless tobacco: Never Used  . Alcohol use 1.2 oz/week    2 Glasses of wine per week  . Drug use: No  . Sexual activity: Not Asked   Other Topics Concern  . None   Social History Narrative   Divorced works in [place] going back to school   2 daughters   Former smoker quit in 1985 smoked half a pack per day for 2 years   Social alcohol red wine   Household of 3   Minimal caffeine     Current Outpatient Prescriptions:  .  Albuterol Sulfate (PROAIR RESPICLICK) [id number] (90 BASE) MCG/ACT AEPB, Inhale 2 Act into the lungs as directed. Pre exercise 15- 30 minutes, Disp: 1 each, Rfl: 2 .  cefUROXime (CEFTIN) 250 MG tablet, , Disp: , Rfl: 0 .  docusate sodium (COLACE) 100 MG capsule, Take by mouth. Take 2 by mouth at bedtime , Disp: , Rfl:  .  estradiol (VIVELLE-DOT) 0.05 MG/24HR patch, , Disp: , Rfl: 0 .  loratadine (LORATADINE ALLERGY RELIEF) 10 MG dissolvable tablet, Take 10 mg by mouth daily., Disp: , Rfl:  .  mometasone (ASMANEX 60 METERED DOSES) 220 MCG/INH inhaler, Inhale 1  puff into the lungs daily., Disp: 1 Inhaler, Rfl: 3 .  montelukast (SINGULAIR) 10 MG tablet, Take 10 mg by mouth at bedtime., Disp: , Rfl:  .  NATURE-THROID 195 MG tablet, , Disp: , Rfl: 1 .  NONFORMULARY OR COMPOUNDED ITEM, 3 application daily. Estriol/testosterone HRT 0.6-2 mg/GM, Disp: , Rfl:  .  Progesterone Micronized (PROGESTERONE PO), Take 150 mg on nights 7 thru 14 of cycle and 300 mg on nights 15 thru 28, Disp: , Rfl:   EXAM:  Vitals:   01/21/16 1506  BP: 108/80  Pulse: 100  Temp: 99 F (37.2 C)    Body mass index is 27.86 kg/m.  GENERAL: vitals reviewed and listed above, alert, oriented, appears well hydrated and in no acute distress  HEENT: atraumatic, conjunttiva clear, no obvious abnormalities on inspection of external nose and ears, normal appearance of ear canals and TMs except  for clear effusion bilat, clear nasal congestion, mild post oropharyngeal erythema with PND, 1+ tonsillar edema with exudate, no sinus TTP  NECK: no obvious masses on inspection, no sig LAD  LUNGS: clear to auscultation bilaterally, no wheezes, rales or rhonchi, good air movement  CV: HRRR, no peripheral edema  MS: moves all extremities without noticeable abnormality  PSYCH: pleasant and cooperative, no obvious depression or anxiety  ASSESSMENT AND PLAN:  Discussed the following assessment and plan:  Sore throat - Plan: POC Rapid Strep A, Culture, Group A Strep   We discussed potential etiologies, with viral versus bacterial pharyngitis being most likel. Rapid and strep culture per her request - doubt would be positive given on abx. Continue course of abx (she gets sick with -cillins)in case strep. Aleve or ibuprofen for pain. We discussed treatment side effects, likely course,  transmission, and signs of developing a serious illness. -of course, we advised to return or notify a doctor immediately if symptoms worsen or persist or new concerns arise.    Patient Instructions  Continue antibiotic.  Aleve or ibuprofen per instructions as needed for pain. Gargling warm salt water and menthol cough drops can also help.  Follow up if worsening, new symptom or symptoms persist.  Pharyngitis Pharyngitis is redness, pain, and swelling (inflammation) of your pharynx.  CAUSES  Pharyngitis is usually caused by infection. Most of the time, these infections are from viruses (viral) and are part of a cold. However, sometimes pharyngitis is caused by bacteria (bacterial). Pharyngitis can also be caused by allergies. Viral pharyngitis may be spread from person to person by coughing, sneezing, and personal items or utensils (cups, forks, spoons, toothbrushes). Bacterial pharyngitis may be spread from person to person by more intimate contact, such as kissing.  SIGNS AND SYMPTOMS  Symptoms of  pharyngitis include:   Sore throat.   Tiredness (fatigue).   Low-grade fever.   Headache.  Joint pain and muscle aches.  Skin rashes.  Swollen lymph nodes.  Plaque-like film on throat or tonsils (often seen with bacterial pharyngitis). DIAGNOSIS  Your health care provider will ask you questions about your illness and your symptoms. Your medical history, along with a physical exam, is often all that is needed to diagnose pharyngitis. Sometimes, a rapid strep test is done. Other lab tests may also be done, depending on the suspected cause.  TREATMENT  Viral pharyngitis will usually get better in 7-10 days without the use of medicine. Bacterial pharyngitis is treated with medicines that kill germs (antibiotics).  HOME CARE INSTRUCTIONS   Drink enough water and fluids to keep your urine  clear or pale yellow.   Only take over-the-counter or prescription medicines as directed by your health care provider:   If you are prescribed antibiotics, make sure you finish them even if you start to feel better.   Do not take aspirin.   Get lots of rest.   Gargle with 8 oz of salt water ( tsp of salt per 1 qt of water) as often as every 1-2 hours to soothe your throat.   Throat lozenges (if you are not at risk for choking) or sprays may be used to soothe your throat. SEEK MEDICAL CARE IF:   You have large, tender lumps in your neck.  You have a rash.  You cough up green, yellow-brown, or bloody spit. SEEK IMMEDIATE MEDICAL CARE IF:   Your neck becomes stiff.  You drool or are unable to swallow liquids.  You vomit or are unable to keep medicines or liquids down.  You have severe pain that does not go away with the use of recommended medicines.  You have trouble breathing (not caused by a stuffy nose). MAKE SURE YOU:   Understand these instructions.  Will watch your condition.  Will get help right away if you are not doing well or get worse.   This information is  not intended to replace advice given to you by your health care provider. Make sure you discuss any questions you have with your health care provider.   Document Released: 05/12/2005 Document Revised: 03/02/2013 Document Reviewed: 01/17/2013 Elsevier Interactive Patient Education 2016 [place]., DO

## 2016-01-23 LAB — CULTURE, GROUP A STREP: Organism ID, Bacteria: NORMAL

## 2016-07-14 ENCOUNTER — Other Ambulatory Visit: Payer: Self-pay | Admitting: Internal Medicine

## 2016-07-14 DIAGNOSIS — Z1231 Encounter for screening mammogram for malignant neoplasm of breast: Secondary | ICD-10-CM

## 2016-08-14 ENCOUNTER — Ambulatory Visit: Payer: BLUE CROSS/BLUE SHIELD

## 2016-08-27 ENCOUNTER — Ambulatory Visit
Admission: RE | Admit: 2016-08-27 | Discharge: 2016-08-27 | Disposition: A | Discharge status: Home / Self Care | Payer: BLUE CROSS/BLUE SHIELD | Source: Ambulatory Visit | Attending: Internal Medicine | Admitting: Internal Medicine

## 2016-08-27 DIAGNOSIS — Z1231 Encounter for screening mammogram for malignant neoplasm of breast: Secondary | ICD-10-CM

## 2017-05-16 ENCOUNTER — Emergency Department (HOSPITAL_COMMUNITY): Payer: BLUE CROSS/BLUE SHIELD

## 2017-05-16 ENCOUNTER — Encounter (HOSPITAL_COMMUNITY): Payer: Self-pay | Admitting: *Deleted

## 2017-05-16 ENCOUNTER — Other Ambulatory Visit: Payer: Self-pay

## 2017-05-16 ENCOUNTER — Emergency Department (HOSPITAL_COMMUNITY)
Admission: EM | Admit: 2017-05-16 | Discharge: 2017-05-16 | Disposition: A | Discharge status: Home / Self Care | Payer: BLUE CROSS/BLUE SHIELD | Attending: Emergency Medicine | Admitting: Emergency Medicine

## 2017-05-16 DIAGNOSIS — G510 Bell's palsy: Secondary | ICD-10-CM | POA: Insufficient documentation

## 2017-05-16 DIAGNOSIS — R29810 Facial weakness: Secondary | ICD-10-CM | POA: Diagnosis present

## 2017-05-16 DIAGNOSIS — E039 Hypothyroidism, unspecified: Secondary | ICD-10-CM | POA: Diagnosis not present

## 2017-05-16 DIAGNOSIS — Z87891 Personal history of nicotine dependence: Secondary | ICD-10-CM | POA: Insufficient documentation

## 2017-05-16 DIAGNOSIS — M542 Cervicalgia: Secondary | ICD-10-CM | POA: Insufficient documentation

## 2017-05-16 DIAGNOSIS — Z79899 Other long term (current) drug therapy: Secondary | ICD-10-CM | POA: Insufficient documentation

## 2017-05-16 LAB — I-STAT CHEM 8, ED
BUN: 17 mg/dL (ref 6–20)
Calcium, Ion: 1.22 mmol/L (ref 1.15–1.40)
Chloride: 104 mmol/L (ref 101–111)
Creatinine, Ser: 0.6 mg/dL (ref 0.44–1.00)
Glucose, Bld: 101 mg/dL — ABNORMAL HIGH (ref 65–99)
HCT: 42 % (ref 36.0–46.0)
Hemoglobin: 14.3 g/dL (ref 12.0–15.0)
Potassium: 4.4 mmol/L (ref 3.5–5.1)
Sodium: 141 mmol/L (ref 135–145)
TCO2: 27 mmol/L (ref 22–32)

## 2017-05-16 LAB — CBC
HCT: 42.2 % (ref 36.0–46.0)
Hemoglobin: 14.2 g/dL (ref 12.0–15.0)
MCH: 29.9 pg (ref 26.0–34.0)
MCHC: 33.6 g/dL (ref 30.0–36.0)
MCV: 88.8 fL (ref 78.0–100.0)
Platelets: 284 10*3/uL (ref 150–400)
RBC: 4.75 MIL/uL (ref 3.87–5.11)
RDW: 13.3 % (ref 11.5–15.5)
WBC: 4.8 10*3/uL (ref 4.0–10.5)

## 2017-05-16 LAB — APTT: aPTT: 26 seconds (ref 24–36)

## 2017-05-16 LAB — COMPREHENSIVE METABOLIC PANEL
ALT: 27 U/L (ref 14–54)
AST: 27 U/L (ref 15–41)
Albumin: 3.8 g/dL (ref 3.5–5.0)
Alkaline Phosphatase: 92 U/L (ref 38–126)
Anion gap: 9 (ref 5–15)
BUN: 16 mg/dL (ref 6–20)
CO2: 25 mmol/L (ref 22–32)
Calcium: 8.8 mg/dL — ABNORMAL LOW (ref 8.9–10.3)
Chloride: 104 mmol/L (ref 101–111)
Creatinine, Ser: 0.62 mg/dL (ref 0.44–1.00)
GFR calc Af Amer: >60 mL/min (ref >60)
GFR calc non Af Amer: >60 mL/min (ref >60)
Glucose, Bld: 95 mg/dL (ref 65–99)
Potassium: 4.2 mmol/L (ref 3.5–5.1)
Sodium: 138 mmol/L (ref 135–145)
Total Bilirubin: 0.5 mg/dL (ref 0.3–1.2)
Total Protein: 7.1 g/dL (ref 6.5–8.1)

## 2017-05-16 LAB — DIFFERENTIAL
Basophils Absolute: 0 10*3/uL (ref 0.0–0.1)
Basophils Relative: 0 %
Eosinophils Absolute: 0.1 10*3/uL (ref 0.0–0.7)
Eosinophils Relative: 2 %
Lymphocytes Relative: 41 %
Lymphs Abs: 2 10*3/uL (ref 0.7–4.0)
Monocytes Absolute: 0.5 10*3/uL (ref 0.1–1.0)
Monocytes Relative: 11 %
Neutro Abs: 2.2 10*3/uL (ref 1.7–7.7)
Neutrophils Relative %: 46 %

## 2017-05-16 LAB — ETHANOL: Alcohol, Ethyl (B): <10 mg/dL (ref <10)

## 2017-05-16 LAB — I-STAT BETA HCG BLOOD, ED (MC, WL, AP ONLY): I-stat hCG, quantitative: <5 m[IU]/mL (ref <5)

## 2017-05-16 LAB — I-STAT TROPONIN, ED: Troponin i, poc: 0.01 ng/mL (ref 0.00–0.08)

## 2017-05-16 LAB — PROTIME-INR
INR: 0.92
Prothrombin Time: 12.3 seconds (ref 11.4–15.2)

## 2017-05-16 MED ORDER — PREDNISONE 10 MG (21) PO TBPK: ORAL_TABLET | ORAL | 0 refills | Status: DC

## 2017-05-16 MED ORDER — VALACYCLOVIR HCL 1 G PO TABS: 1000.0000 mg | ORAL_TABLET | Freq: Three times a day (TID) | ORAL | 0 refills | Status: AC

## 2017-05-16 MED ORDER — METOCLOPRAMIDE HCL 5 MG/ML IJ SOLN
10.0000 mg | Freq: Once | INTRAMUSCULAR | Status: AC
  Administered 2017-05-16: 10 mg via INTRAVENOUS
  Filled 2017-05-16: qty 2

## 2017-05-16 MED ORDER — SODIUM CHLORIDE 0.9 % IV BOLUS (SEPSIS)
1000.0000 mL | Freq: Once | INTRAVENOUS | Status: AC
  Administered 2017-05-16: 1000 mL via INTRAVENOUS

## 2017-05-16 MED ORDER — DEXAMETHASONE SODIUM PHOSPHATE 10 MG/ML IJ SOLN
10.0000 mg | Freq: Once | INTRAMUSCULAR | Status: AC
  Administered 2017-05-16: 10 mg via INTRAVENOUS
  Filled 2017-05-16: qty 1

## 2017-05-16 NOTE — ED Provider Notes (Signed)
[facility] Provider Note   CSN: [id number] Arrival date & time: 05/16/17  7829     History   Chief Complaint Chief Complaint  Patient presents with  . Headache    HPI OLEVA KOO is a 51 y.o. female who presents with a 3-day history of left sided frontal headache and  few hour history of left sided facial asymmetry.  Patient reports she has had associated left-sided neck pain as well.  She reports the left side of her face and neck feels weird, but denies numbness or tingling.  She denies any numbness or tingling or weakness of any extremities.  She is taken ibuprofen at home with some relief of her headache.  She has no history of migraines.  She also denies any fevers, chest pain, shortness of breath, abdominal pain, nausea, vomiting, photosensitivity, urinary symptoms.   HPI  Past Medical History:  Diagnosis Date  . Allergic rhinitis   . Anal fissure 12/03/2007  . Bronchiectasis    ill from childhood pneumonia? had broch in about 1999, pft's WNL 04/12/09  . Depression    DENIES 11/12/2015  . DYSPNEA 02/12/2009  . Eczema   . GERD (gastroesophageal reflux disease)   . Headache(784.0) 10/16/2008  . Hemorrhage of rectum and anus 10/06/2007  . HPV test positive   . Hypothyroidism   . Myalgia and myositis, unspecified 09/2010   Panosh  . Nontoxic multinodular goiter 07/03/10   [name]  . PALPITATIONS, RECURRENT 03/22/2007  . Periorbital swelling with pruritis 09/17/2012   no dx  prob allergic but context and hx  noevidence of infection    . Plantar fasciitis   . PREMATURE VENTRICULAR CONTRACTIONS 05/21/2010  . Sleep disturbance     Patient Active Problem List   Diagnosis Date Noted  . Cervical high risk HPV (human papillomavirus) test positive 09/26/2013  . URI, acute 09/21/2013  . Hormone replacement therapy (HRT) 09/21/2013  . Encounter for routine gynecological examination 09/21/2013  . Rash 09/21/2013  . OCP (oral  contraceptive pills) initiation 12/17/2012  . General counseling and advice on female contraception 12/17/2012  . Allergic rhinitis 09/17/2012  . Menstrual irregularity 03/19/2012  . Toe inflammation 02/03/2012  . Plantar fasciitis 05/07/2011  . Visit for preventive health examination 05/07/2011  . Bronchiectasis   . GERD 06/10/2010  . IRREGULAR MENSES 04/29/2010  . ECZEMA 02/04/2010  . OTHER SLEEP DISTURBANCES 01/21/2010  . PERSISTENT DISORDER INITIATING/MAINTAINING SLEEP 07/16/2009  . HYPOTHYROIDISM, POST-RADIATION 01/03/2008  . GOITER, MULTINODULAR 08/16/2007  . DYSTHYMIA, SITUATIONAL 07/30/2007  . JOINT CREPITUS, KNEE 03/22/2007  . PALPITATIONS, RECURRENT 03/22/2007    Past Surgical History:  Procedure Laterality Date  . BRONCHOSCOPY     in 1980's bronchiesctesis III Dr. Consuelo Pandy  . CARPAL TUNNEL RELEASE Bilateral    Sypher  . [place] and 2000  . COLONOSCOPY    . NASAL TURBINATE REDUCTION     deviated septum    OB History    No data available       Home Medications    Prior to Admission medications   Medication Sig Start Date End Date Taking? Authorizing Provider  Albuterol Sulfate (PROAIR RESPICLICK) 562 (90 BASE) MCG/ACT AEPB Inhale 2 Act into the lungs as directed. Pre exercise 15- 30 minutes 03/06/15   Panosh, Standley Brooking, MD  cefUROXime (CEFTIN) 250 MG tablet  01/18/16   [provider]  docusate sodium (COLACE) 100 MG capsule Take by mouth. Take 2  by mouth at bedtime     [provider]  estradiol (VIVELLE-DOT) 0.05 MG/24HR patch  03/11/15   [provider]  loratadine (LORATADINE ALLERGY RELIEF) 10 MG dissolvable tablet Take 10 mg by mouth daily.    [provider]  mometasone (ASMANEX 60 METERED DOSES) 220 MCG/INH inhaler Inhale 1 puff into the lungs daily. 03/20/15   Panosh, Standley Brooking, MD  montelukast (SINGULAIR) 10 MG tablet Take 10 mg by mouth at bedtime.    [provider]  NATURE-THROID 195 MG  tablet  03/14/15   [provider]  NONFORMULARY OR COMPOUNDED ITEM 3 application daily. Estriol/testosterone HRT 0.6-2 mg/GM    [provider]  predniSONE (STERAPRED UNI-PAK 21 TAB) 10 MG (21) TBPK tablet Take 6 tabs by mouth daily  for 2 days, then 5 tabs for 2 days, then 4 tabs for 2 days, then 3 tabs for 2 days, 2 tabs for 2 days, then 1 tab by mouth daily for 2 days 05/16/17   Isla Pence, MD  Progesterone Micronized (PROGESTERONE PO) Take 150 mg on nights 7 thru 14 of cycle and 300 mg on nights 15 thru 28    [provider]  valACYclovir (VALTREX) 1000 MG tablet Take 1 tablet (1,000 mg total) by mouth 3 (three) times daily. 05/16/17   Isla Pence, MD    Family History Family History  Problem Relation Age of Onset  . Diabetes Mother   . Hyperlipidemia Mother   . Other Father        sleep walking  . Aneurysm Paternal Uncle        AAA  . Heart disease Maternal Grandfather   . Colon cancer Neg Hx   . Breast cancer Neg Hx     Social History Social History   Tobacco Use  . Smoking status: Former Smoker    Types: Cigarettes  . Smokeless tobacco: Never Used  Substance Use Topics  . Alcohol use: Yes    Alcohol/week: 1.2 oz    Types: 2 Glasses of wine per week  . Drug use: No     Allergies   Trazodone and nefazodone   Review of Systems Review of Systems  Constitutional: Negative for chills and fever.  HENT: Negative for facial swelling and sore throat.   Respiratory: Negative for shortness of breath.   Cardiovascular: Negative for chest pain.  Gastrointestinal: Negative for abdominal pain, nausea and vomiting.  Genitourinary: Negative for dysuria.  Musculoskeletal: Positive for neck pain. Negative for back pain.  Skin: Negative for rash and wound.  Neurological: Positive for facial asymmetry and headaches. Negative for weakness and numbness.  Psychiatric/Behavioral: The patient is not nervous/anxious.      Physical Exam Updated  Vital Signs BP 115/63   Pulse 71   Temp 98.3 F (36.8 C)   Resp 14   Ht 5\' 3"  (1.6 m)   Wt 77.1 kg (170 lb)   LMP 11/13/2012   SpO2 100%   BMI 30.11 kg/m   Physical Exam  Constitutional: She appears well-developed and well-nourished. No distress.  HENT:  Head: Normocephalic and atraumatic.  Mouth/Throat: Oropharynx is clear and moist. No oropharyngeal exudate.  Eyes: Conjunctivae are normal. Pupils are equal, round, and reactive to light. Right eye exhibits no discharge. Left eye exhibits no discharge. No scleral icterus.  Neck: Normal range of motion. Neck supple. No thyromegaly present.  No tenderness to the left side of the neck or midline cervical spine; no discoloration or masses palpated  Cardiovascular: Normal rate, regular rhythm, normal heart sounds and intact distal pulses. Exam reveals no gallop and no friction rub.  No murmur heard. Pulmonary/Chest: Effort normal and breath sounds normal. No stridor. No respiratory distress. She has no wheezes. She has no rales.  Abdominal: Soft. Bowel sounds are normal. She exhibits no distension. There is no tenderness. There is no rebound and no guarding.  Musculoskeletal: She exhibits no edema.  Lymphadenopathy:    She has no cervical adenopathy.  Neurological: She is alert. Coordination normal.  Facial asymmetry on the left only with smiling Otherwise, CNs intact; normal sensation throughout; 5/5 strength in all 4 extremities; equal bilateral grip strength; no ataxia on finger to nose; normal heel to shin test   Skin: Skin is warm and dry. No rash noted. She is not diaphoretic. No pallor.  Psychiatric: She has a normal mood and affect.  Nursing note and vitals reviewed.    ED Treatments / Results  Labs (all labs ordered are listed, but only abnormal results are displayed) Labs Reviewed  COMPREHENSIVE METABOLIC PANEL - Abnormal; Notable for the following components:      Result Value   Calcium 8.8 (*)    All other  components within normal limits  I-STAT CHEM 8, ED - Abnormal; Notable for the following components:   Glucose, Bld 101 (*)    All other components within normal limits  ETHANOL  PROTIME-INR  APTT  CBC  DIFFERENTIAL  I-STAT TROPONIN, ED  I-STAT BETA HCG BLOOD, ED ([facility], [facility], [facility] ONLY)    EKG  EKG Interpretation  Date/Time:  Saturday May 16 2017 10:41:58 EST Ventricular Rate:  65 PR Interval:    QRS Duration: 90 QT Interval:  415 QTC Calculation: 432 R Axis:   46 Text Interpretation:  Sinus rhythm Borderline short PR interval Confirmed by Isla Pence [phone]) on 05/16/2017 10:44:16 AM       Radiology Ct Head Wo Contrast  Result Date: 05/16/2017 CLINICAL DATA:  Pt c/o head ache and neck pain since Wed and today has left side facial droop EXAM: CT HEAD WITHOUT CONTRAST TECHNIQUE: Contiguous axial images were obtained from the base of the skull through the vertex without intravenous contrast. COMPARISON:  11/08/2015 FINDINGS: Brain: No evidence of acute infarction, hemorrhage, hydrocephalus, extra-axial collection or mass lesion/mass effect. Vascular: No hyperdense vessel or unexpected calcification. Skull: Normal. Negative for fracture or focal lesion. Sinuses/Orbits: Globes and orbits are unremarkable. Visualized sinuses and mastoid air cells are clear. Other: None. IMPRESSION: Normal unenhanced CT scan of the brain. Electronically Signed   By: Lajean Manes M.D.   On: 05/16/2017 11:20    Procedures Procedures (including critical care time)  Medications Ordered in ED Medications  sodium chloride 0.9 % bolus 1,000 mL (0 mLs Intravenous Stopped 05/16/17 1259)  metoCLOPramide (REGLAN) injection 10 mg (10 mg Intravenous Given 05/16/17 1206)  dexamethasone (DECADRON) injection 10 mg (10 mg Intravenous Given 05/16/17 1251)     Initial Impression / Assessment and Plan / ED Course  I have reviewed the triage vital signs and the nursing notes.  Pertinent labs & imaging  results that were available during my care of the patient were reviewed by me and considered in my medical decision making (see chart for details).     Patient with a 3-day history of headache and left-sided facial paralysis.  CT head and labs unremarkable.  No signs of CVA seen on head CT.  The patient with symptoms consistent with left-sided Bell's palsy.  Patient  given IV Reglan, fluids, Decadron in the ED.  Will discharge home with Valtrex and prednisone.  Follow-up to PCP.  Patient also evaluated by Dr. Gilford Raid who guided the patient's management and agrees with plan.  Final Clinical Impressions(s) / ED Diagnoses   Final diagnoses:  Bell's palsy    ED Discharge Orders        Ordered    predniSONE (STERAPRED UNI-PAK 21 TAB) 10 MG (21) TBPK tablet     05/16/17 1212    valACYclovir (VALTREX) 1000 MG tablet  3 times daily     05/16/17 [address] PA-C 05/16/17 1601    Isla Pence, MD 05/17/17 [phone]

## 2017-05-16 NOTE — ED Triage Notes (Signed)
Pt reports  HA started on WED. And has been relieved by OTC the patient reports Lt sided facial droop started this AM. Pt also reports ain to Lt side of neck  And Lt shoulder. No vision changes no weakness.

## 2017-05-16 NOTE — ED Notes (Signed)
Patient able to ambulate independently  

## 2017-08-26 ENCOUNTER — Other Ambulatory Visit: Payer: Self-pay | Admitting: Dermatology

## 2017-08-26 ENCOUNTER — Other Ambulatory Visit: Payer: Self-pay | Admitting: Internal Medicine

## 2017-08-26 DIAGNOSIS — Z1231 Encounter for screening mammogram for malignant neoplasm of breast: Secondary | ICD-10-CM

## 2017-09-21 ENCOUNTER — Ambulatory Visit
Admission: RE | Admit: 2017-09-21 | Discharge: 2017-09-21 | Disposition: A | Discharge status: Home / Self Care | Payer: BLUE CROSS/BLUE SHIELD | Source: Ambulatory Visit | Attending: Internal Medicine | Admitting: Internal Medicine

## 2017-09-21 DIAGNOSIS — Z1231 Encounter for screening mammogram for malignant neoplasm of breast: Secondary | ICD-10-CM

## 2017-12-10 ENCOUNTER — Ambulatory Visit: Payer: BLUE CROSS/BLUE SHIELD | Admitting: Family Medicine

## 2017-12-14 ENCOUNTER — Ambulatory Visit: Payer: BLUE CROSS/BLUE SHIELD | Admitting: Internal Medicine

## 2017-12-14 ENCOUNTER — Encounter: Payer: Self-pay | Admitting: Internal Medicine

## 2017-12-14 VITALS — BP 108/70 | HR 74 | Temp 97.7°F | Wt 177.2 lb

## 2017-12-14 DIAGNOSIS — H01119 Allergic dermatitis of unspecified eye, unspecified eyelid: Secondary | ICD-10-CM

## 2017-12-14 MED ORDER — DESONIDE 0.05 % EX OINT: 1.0000 "application " | TOPICAL_OINTMENT | Freq: Two times a day (BID) | CUTANEOUS | 0 refills | Status: AC

## 2017-12-14 MED ORDER — PREDNISONE 20 MG PO TABS: 20.0000 mg | ORAL_TABLET | Freq: Two times a day (BID) | ORAL | 0 refills | Status: DC

## 2017-12-14 NOTE — Progress Notes (Signed)
Chief Complaint  Patient presents with  . Rash    Rash around eyes for about 2-3 weeks. Pt states that it almost resolves then it gets worse again. Pt c/o swelling, itching and pain. Pt notes some mornings she will wake up and eyes will be almost swollen shut. Denis blurred vision or discharge. States her eyes burn throughout the day. No OTC tx     HPI: Lori Cunningham 52 y.o.  sda today  3-4 weeks  Of   euelid rash and redness and itching  Began beofre wedding or niece .   Has eczema sensitive skin had used for 2-3 days med that Dr  Denna Haggard had rused in remote past  On eye lids  Not as bad .  Has toppe dusing makae up and using vaseline   To avoid other  But asks if other meds could help or prednisone   Nothing for days. X vaseline Off pfr 1.5 weeks.   Only    2-3 days.  Dose house clearing and ealiy could have touched face eye with hands    No recent  Nasal  x for family wedding no new topicals recently .    ROS: See pertinent positives and negatives per HPI. No vision change but some redness in am  nof ever etc   Past Medical History:  Diagnosis Date  . Allergic rhinitis   . Anal fissure 12/03/2007  . Bronchiectasis    ill from childhood pneumonia? had broch in about 1999, pft's WNL 04/12/09  . Depression    DENIES 11/12/2015  . DYSPNEA 02/12/2009  . Eczema   . GERD (gastroesophageal reflux disease)   . Headache(784.0) 10/16/2008  . Hemorrhage of rectum and anus 10/06/2007  . HPV test positive   . Hypothyroidism   . Myalgia and myositis, unspecified 09/2010   Analia Zuk  . Nontoxic multinodular goiter 07/03/10   [name]  . PALPITATIONS, RECURRENT 03/22/2007  . Periorbital swelling with pruritis 09/17/2012   no dx  prob allergic but context and hx  noevidence of infection    . Plantar fasciitis   . PREMATURE VENTRICULAR CONTRACTIONS 05/21/2010  . Sleep disturbance     Family History  Problem Relation Age of Onset  . Diabetes Mother   . Hyperlipidemia Mother   . Other Father         sleep walking  . Aneurysm Paternal Uncle        AAA  . Heart disease Maternal Grandfather   . Colon cancer Neg Hx   . Breast cancer Neg Hx     Social History   Socioeconomic History  . Marital status: Divorced    Spouse name: Not on file  . Number of children: 2  . Years of education: Not on file  . Highest education level: Not on file  Occupational History  . 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 Financial resource strain: Not on file  . Food insecurity:    Worry: Not on file    Inability: Not on file  . Transportation needs:    Medical: Not on file    Non-medical: Not on file  Tobacco Use  . Smoking status: Former Smoker    Types: Cigarettes  . Smokeless tobacco: Never Used  Substance and Sexual Activity  . Alcohol use: Yes    Alcohol/week: 1.2 oz    Types: 2 Glasses of wine per week  . Drug use: No  . Sexual activity:  Not on file  Lifestyle  . Physical activity:    Days per week: Not on file    Minutes per session: Not on file  . Stress: Not on file  Relationships  . Social connections:    Talks on phone: Not on file    Gets together: Not on file    Attends religious service: Not on file    Active member of club or organization: Not on file    Attends meetings of clubs or organizations: Not on file    Relationship status: Not on file  Other Topics Concern  . Not on file  Social History Narrative   Divorced works in [place] going back to school   2 daughters   Former smoker quit in 1985 smoked half a pack per day for 2 years   Social alcohol red wine   Household of 3   Minimal caffeine    Outpatient Medications Prior to Visit  Medication Sig Dispense Refill  . Albuterol Sulfate (PROAIR RESPICLICK) 970 (90 BASE) MCG/ACT AEPB Inhale 2 Act into the lungs as directed. Pre exercise 15- 30 minutes 1 each 2  . cefUROXime (CEFTIN) 250 MG tablet   0  . docusate sodium (COLACE) 100 MG capsule Take by  mouth. Take 2 by mouth at bedtime     . estradiol (VIVELLE-DOT) 0.05 MG/24HR patch   0  . loratadine (LORATADINE ALLERGY RELIEF) 10 MG dissolvable tablet Take 10 mg by mouth daily.    . mometasone (ASMANEX 60 METERED DOSES) 220 MCG/INH inhaler Inhale 1 puff into the lungs daily. 1 Inhaler 3  . montelukast (SINGULAIR) 10 MG tablet Take 10 mg by mouth at bedtime.    Marland Kitchen NATURE-THROID 195 MG tablet   1  . NONFORMULARY OR COMPOUNDED ITEM 3 application daily. Estriol/testosterone HRT 0.6-2 mg/GM    . predniSONE (STERAPRED UNI-PAK 21 TAB) 10 MG (21) TBPK tablet Take 6 tabs by mouth daily  for 2 days, then 5 tabs for 2 days, then 4 tabs for 2 days, then 3 tabs for 2 days, 2 tabs for 2 days, then 1 tab by mouth daily for 2 days 42 tablet 0  . Progesterone Micronized (PROGESTERONE PO) Take 150 mg on nights 7 thru 14 of cycle and 300 mg on nights 15 thru 28    . valACYclovir (VALTREX) 1000 MG tablet Take 1 tablet (1,000 mg total) by mouth 3 (three) times daily. 21 tablet 0   No facility-administered medications prior to visit.      EXAM:  BP 108/70 (BP Location: Right Arm, Patient Position: Sitting, Cuff Size: Normal)   Pulse 74   Temp 97.7 F (36.5 C) (Oral)   Wt 177 lb 3.2 oz (80.4 kg)   LMP 11/13/2012   BMI 31.39 kg/m   Body mass index is 31.39 kg/m.  GENERAL: vitals reviewed and listed above, alert, oriented, appears well hydrated and in no acute distress with bilateral eye lid dermatitis left more than right   Min edema    Pink  Rash bilateral eye lids   And left  inferior  .blotch no vesicle discharge .   Edema  Bulbar conjunctiva is clear .  HEENT: atraumatic, conjunctiva  clear, no obvious abnormalities on inspection of external nose and ears MS: moves all extremities without noticeable focal  Abnormality Hands no rash  PSYCH: pleasant and cooperative, no obvious depression or anxiety  ASSESSMENT AND PLAN:  Discussed the following assessment and plan:  Eyelid dermatitis,  allergic/contact Prefer  tno pred bu rx    printed  And use less is best  Topical if needed once a day   And  Water based ointment protections  .   Continue avoid eye makeup all chemical poss irritants.  -Patient advised to return or notify health care team  if symptoms worsen ,persist or new concerns arise.  Patient Instructions  Eyelid dermatitis    Avoid .  Most topical  Cool compresses   noHands to face nail    Cosmetics and  Nail chemical etc can sensitize the area .    Let me know the  Type do topical used  .   and we can  Send in   A topical   Less is best on the eyes at this time.      Standley Brooking. March Joos M.D.

## 2017-12-14 NOTE — Patient Instructions (Addendum)
Eyelid dermatitis    Avoid .  Most topical  Cool compresses   noHands to face nail    Cosmetics and  Nail chemical etc can sensitize the area .    Let me know the  Type do topical used  .   and we can  Send in   A topical   Less is best on the eyes at this time.

## 2018-03-08 ENCOUNTER — Ambulatory Visit (INDEPENDENT_AMBULATORY_CARE_PROVIDER_SITE_OTHER): Payer: BLUE CROSS/BLUE SHIELD | Admitting: Psychiatry

## 2018-03-08 DIAGNOSIS — F4323 Adjustment disorder with mixed anxiety and depressed mood: Secondary | ICD-10-CM

## 2018-03-08 NOTE — Progress Notes (Signed)
      [facility] Counselor/Therapist Progress Note   Patient ID: KILI GRACY, MRN: [id number]  Date: 03/08/2018  Timespent: 55 minutes  Treatment Type: Individual  Subjective: The client reports that her boyfriend recently relocated from [place] and moved in with her.  He has been here approximately 6 weeks.  Their plan was to start a restaurant in [place].  Her boyfriend has worked in Ambulance person for years and is currently working as a Chief Operating Officer.  They were not able to get enough money together for that venture. Last week he had a major heart attack and now he is in congestive heart failure.  The client reports that she had dated this man in high school and 3 years ago they had gotten back together.  She had been driving up to [place] every other weekend but was exhausted by the trip. Today she wants to address her boyfriend moving in.  Her career and vocation as well as money.  Her alcohol use.  And her eating.  Today we covered the issues around her boyfriend.  She determined that she needed to ask him for $150 a week to cover the house payment and food.  She felt this would be reasonable.  We discussed being directed to the point in an assertive manner. She agreed to do so.  At next session we will address her other issues.  Interventions:Solution Focused, Counsellor, Psychosocial Skills: Boundaries and Supportive  Mental Status Exam:   Appearance:   Casual     Behavior:  Appropriate  Motor:  Normal  Speech/Language:   Clear and Coherent  Affect:  Appropriate  Mood:  anxious and irritable  Thought process:  Coherent  Thought content:    Logical  Perceptual disturbances:    Normal  Orientation:  Full (Time, Place, and Person)  Attention:  Good  Concentration:  good  Memory:  Immediate  Fund of knowledge:   Good  Insight:    Good  Judgment:   Good  Impulse Control:  good    Reported Symptoms: Irritable, anxious.  Risk Assessment: Danger to  Self:  No Self-injurious Behavior: No Danger to Others: No Duty to Warn:no Physical Aggression / Violence:No  Access to Firearms a concern: No  Gang Involvement:No   Diagnosis:   ICD-10-CM   1. Adjustment disorder with mixed anxiety and depressed mood F43.23      Plan: Set boundaries in an assertive manner with her boyfriend.  Albertina Parr Ladawna [place], [place]

## 2018-03-15 ENCOUNTER — Ambulatory Visit: Payer: BLUE CROSS/BLUE SHIELD | Admitting: Psychiatry

## 2018-03-15 DIAGNOSIS — F320 Major depressive disorder, single episode, mild: Secondary | ICD-10-CM

## 2018-03-15 NOTE — Progress Notes (Signed)
      [facility] Counselor/Therapist Progress Note   Patient ID: Lori Cunningham, MRN: [id number]  Date: 03/15/2018  Timespent: 55 minutes  Treatment Type: Individual  Subjective: The client states that her boyfriend has a full schedule this week.  She reports this makes her much less anxious.  She feels he should be able to contribute to the finances in the household.  She states, "I need to make more money." Today the client describes a lack of motivation, sadness to, irritability and lack of meaning in her life. The client denies any suicidality or thoughts of death.  Her desire is to become a Technical sales engineer.  She is unsure how to make this happen.  She would like to open a restaurant, run a Engineer, maintenance, or run some kind of independent enterprise.  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  She does cotherapy with her supervisor which she thinks most people do not want to do. She would like to expand her practice but is unsure how to do so.  "I am unsure how to do this so I feel like I need a partner."  The client does have a friend who works as an Therapist, sports in a Location manager.  We discussed her meeting up with that friend and having lunch.  Client's belief is, "this is not going to work."  I asked the client why she believed it wasn't going to work?  She did not have a clear answer.  I pointed out to the client that this was catastrophic thinking.  We discussed the client identifying more of those negative thoughts and asking her self the question why is this true?  Interventions:Solution Focused, Supportive and Other: EMDR  Mental Status Exam:   Appearance:   Well Groomed     Behavior:  Appropriate  Motor:  Normal  Speech/Language:   Clear and Coherent  Affect:  Depressed  Mood:  anxious and depressed  Thought process:  Coherent  Thought content:    Logical  Perceptual disturbances:    Normal  Orientation:  Full (Time, Place, and Person)   Attention:  Good  Concentration:  good  Memory:  Immediate  Fund of knowledge:   Good  Insight:    Good  Judgment:   Good  Impulse Control:  good    Reported Symptoms: Anxious, depressed, fatigued, lack of motivation.  Risk Assessment: Danger to Self:  No Self-injurious Behavior: No Danger to Others: No Duty to Warn:no Physical Aggression / Violence:No  Access to Firearms a concern: No  Gang Involvement:No   Diagnosis:   ICD-10-CM   1. Major depressive disorder, single episode, mild (HCC) F32.0      Plan: Mood independent behavior, assertiveness.  Lori Cunningham, [place]

## 2018-03-21 DIAGNOSIS — F331 Major depressive disorder, recurrent, moderate: Secondary | ICD-10-CM | POA: Insufficient documentation

## 2018-03-21 DIAGNOSIS — F4321 Adjustment disorder with depressed mood: Secondary | ICD-10-CM

## 2018-03-22 ENCOUNTER — Ambulatory Visit: Payer: BLUE CROSS/BLUE SHIELD | Admitting: Psychiatry

## 2018-03-22 DIAGNOSIS — F320 Major depressive disorder, single episode, mild: Secondary | ICD-10-CM | POA: Diagnosis not present

## 2018-03-22 NOTE — Progress Notes (Signed)
      [facility] Counselor/Therapist Progress Note   Patient ID: TABITHA RIGGINS, MRN: [id number]  Date: 03/22/2018  Timespent: 58 minutes   Treatment Type: Individual   Reported Symptoms: Lack of motivation and anxiety   Mental Status Exam:    Appearance:   Casual     Behavior:  Appropriate  Motor:  Normal  Speech/Language:   Clear and Coherent  Affect:  Depressed  Mood:  anxious and sad  Thought process:  normal  Thought content:    WNL  Sensory/Perceptual disturbances:    WNL  Orientation:  oriented to person, place, time/date and situation  Attention:  Good  Concentration:  Good  Memory:  WNL  Fund of knowledge:   Good  Insight:    Good  Judgment:   Good  Impulse Control:  Good     Risk Assessment: Danger to Self:  No Self-injurious Behavior: No Danger to Others: No Duty to Warn:no Physical Aggression / Violence:No  Access to Firearms a concern: No  Gang Involvement:No    Subjective: The client states that she hurt herself last week while in her running group.  She states, "I had to cancel my two appointments with a new supervisor and my friend who does the drop ship that I am interested in."  She agrees to reschedule the appointments today. The client states that she feels like her food and alcohol use are more than it should be.  She feels like she uses both of them to self medicate.  Using EMDR we focused on the target of the client's food and alcohol intake.  Her negative cognition was "I do not care".  She feels angry and anxious in her throat.  Her subjective units of distress equals a 6+.  As the client processed using eye movement she said that it is easy to overeat.  She remembered as a child that she was told that she was lazy and slothful.  "I learned that from my mom."  She states that she would go to her grandmother's house over the summers for "diet camp."  She went on to say "I feel flawed and I am a disappointment to others.  I have this lack  of initiative and I feel uncertain about starting things by myself."  As the client continued to process she was able to reduce her subjective units of distress to less than 2.  At the end of the session her positive cognition was "I am worthy".  Interventions: Solution-Oriented/Positive Psychology, Eye Movement Desensitization and Reprocessing (EMDR), Insight-Oriented and Interpersonal   Diagnosis:   ICD-10-CM   1. Major depressive disorder, single episode, mild ([place]) F32.0      Plan: Pay attention to her diet and alcohol intake, no more than one drink/night. 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   Albertina Parr [place], [place]

## 2018-03-23 ENCOUNTER — Ambulatory Visit: Payer: BLUE CROSS/BLUE SHIELD | Admitting: Family Medicine

## 2018-03-23 ENCOUNTER — Encounter: Payer: Self-pay | Admitting: Family Medicine

## 2018-03-23 VITALS — BP 120/80 | HR 68 | Temp 97.8°F | Resp 12 | Wt 184.6 lb

## 2018-03-23 DIAGNOSIS — S8391XA Sprain of unspecified site of right knee, initial encounter: Secondary | ICD-10-CM | POA: Diagnosis not present

## 2018-03-23 DIAGNOSIS — J340 Abscess, furuncle and carbuncle of nose: Secondary | ICD-10-CM | POA: Diagnosis not present

## 2018-03-23 DIAGNOSIS — M79604 Pain in right leg: Secondary | ICD-10-CM | POA: Diagnosis not present

## 2018-03-23 MED ORDER — MUPIROCIN 2 % EX OINT: 1.0000 "application " | TOPICAL_OINTMENT | Freq: Two times a day (BID) | CUTANEOUS | 0 refills | Status: AC

## 2018-03-23 NOTE — Progress Notes (Signed)
ACUTE VISIT   HPI:  Chief Complaint  Patient presents with  . brusing    right shin, x1 week, painful    Lori Cunningham is a 52 y.o. female, who is here today complaining of 6 days of right lower extremity pain. Sudden onset of pretibial pain that started while she was running. Initially pain was severe but is gradually getting better. Constant dull pain, 3/10.  Pain is exacerbated by standing and walking, alleviated by rest. Mild burning sensation on area.  No erythema or calf pain. She denies history of back pain.  No prior injuries, recent surgeries, or long travel. She is on hormonal therapy. She denies chest pain, dyspnea, palpitation, or diaphoresis.  She has not tried OTC analgesic.  She is also complaining about "very painful" sore in nose, right nostril, onset.  She has had this problem for about a month, she thinks it might be getting worse, no history of trauma. She has had some mild nosebleeding. She has history of allergies, currently she is on Singulair and Allegra, she is not on intranasal steroids.  She denies history of drug use. She has not try OTC medications. No associated fever, chills, sore throat, and no Hx of oral/vaginal/eye ulcers.    Review of Systems  Constitutional: Negative for chills and fever.  HENT: Positive for nosebleeds and postnasal drip. Negative for mouth sores and sore throat.   Eyes: Negative for pain and redness.  Respiratory: Negative for shortness of breath and wheezing.   Cardiovascular: Positive for leg swelling. Negative for chest pain and palpitations.  Musculoskeletal: Positive for myalgias. Negative for back pain and joint swelling.  Skin: Negative for color change and rash.  Allergic/Immunologic: Positive for environmental allergies.  Neurological: Negative for weakness and numbness.  Hematological: Negative for adenopathy. Does not bruise/bleed easily.      Current Outpatient Medications on File  Prior to Visit  Medication Sig Dispense Refill  . Albuterol Sulfate (PROAIR RESPICLICK) 161 (90 BASE) MCG/ACT AEPB Inhale 2 Act into the lungs as directed. Pre exercise 15- 30 minutes 1 each 2  . cefUROXime (CEFTIN) 250 MG tablet   0  . desonide (DESOWEN) 0.05 % ointment Apply 1 application topically 2 (two) times daily. 15 g 0  . docusate sodium (COLACE) 100 MG capsule Take by mouth. Take 2 by mouth at bedtime     . estradiol (VIVELLE-DOT) 0.05 MG/24HR patch   0  . loratadine (LORATADINE ALLERGY RELIEF) 10 MG dissolvable tablet Take 10 mg by mouth daily.    . mometasone (ASMANEX 60 METERED DOSES) 220 MCG/INH inhaler Inhale 1 puff into the lungs daily. 1 Inhaler 3  . montelukast (SINGULAIR) 10 MG tablet Take 10 mg by mouth at bedtime.    Marland Kitchen NATURE-THROID 195 MG tablet   1  . NONFORMULARY OR COMPOUNDED ITEM 3 application daily. Estriol/testosterone HRT 0.6-2 mg/GM    . predniSONE (DELTASONE) 20 MG tablet Take 1 tablet (20 mg total) by mouth 2 (two) times daily with a meal. 10 tablet 0  . predniSONE (STERAPRED UNI-PAK 21 TAB) 10 MG (21) TBPK tablet Take 6 tabs by mouth daily  for 2 days, then 5 tabs for 2 days, then 4 tabs for 2 days, then 3 tabs for 2 days, 2 tabs for 2 days, then 1 tab by mouth daily for 2 days 42 tablet 0  . Progesterone Micronized (PROGESTERONE PO) Take 150 mg on nights 7 thru 14 of cycle and 300 mg on  nights 15 thru 28    . temazepam (RESTORIL) 15 MG capsule Take 15 mg by mouth at bedtime.    . valACYclovir (VALTREX) 1000 MG tablet Take 1 tablet (1,000 mg total) by mouth 3 (three) times daily. 21 tablet 0   No current facility-administered medications on file prior to visit.      Past Medical History:  Diagnosis Date  . Allergic rhinitis   . Anal fissure 12/03/2007  . Bronchiectasis    ill from childhood pneumonia? had broch in about 1999, pft's WNL 04/12/09  . Depression    DENIES 11/12/2015  . DYSPNEA 02/12/2009  . Eczema   . GERD (gastroesophageal reflux disease)     . Headache(784.0) 10/16/2008  . Hemorrhage of rectum and anus 10/06/2007  . HPV test positive   . Hypothyroidism   . Myalgia and myositis, unspecified 09/2010   Panosh  . Nontoxic multinodular goiter 07/03/10   [name]  . PALPITATIONS, RECURRENT 03/22/2007  . Periorbital swelling with pruritis 09/17/2012   no dx  prob allergic but context and hx  noevidence of infection    . Plantar fasciitis   . PREMATURE VENTRICULAR CONTRACTIONS 05/21/2010  . Sleep disturbance    Allergies  Allergen Reactions  . Trazodone And Nefazodone Palpitations    Social History   Socioeconomic History  . Marital status: Divorced    Spouse name: Not on file  . Number of children: 2  . Years of education: Not on file  . Highest education level: Not on file  Occupational History  . 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 Financial resource strain: Not on file  . Food insecurity:    Worry: Not on file    Inability: Not on file  . Transportation needs:    Medical: Not on file    Non-medical: Not on file  Tobacco Use  . Smoking status: Former Smoker    Types: Cigarettes  . Smokeless tobacco: Never Used  Substance and Sexual Activity  . Alcohol use: Yes    Alcohol/week: 2.0 standard drinks    Types: 2 Glasses of wine per week  . Drug use: No  . Sexual activity: Not on file  Lifestyle  . Physical activity:    Days per week: Not on file    Minutes per session: Not on file  . Stress: Not on file  Relationships  . Social connections:    Talks on phone: Not on file    Gets together: Not on file    Attends religious service: Not on file    Active member of club or organization: Not on file    Attends meetings of clubs or organizations: Not on file    Relationship status: Not on file  Other Topics Concern  . Not on file  Social History Narrative   Divorced works in [place] going back to school   2 daughters   Former smoker quit in 1985 smoked  half a pack per day for 2 years   Social alcohol red wine   Household of 3   Minimal caffeine    Vitals:   03/23/18 0706  BP: 120/80  Pulse: 68  Resp: 12  Temp: 97.8 F (36.6 C)  SpO2: 96%   Body mass index is 32.7 kg/m.   Physical Exam  Nursing note and vitals reviewed. Constitutional: She is oriented to person, place, and time. She appears well-developed. She does not appear ill. No distress.  HENT:  Head: Normocephalic and atraumatic.  Nose: Sinus tenderness present. No mucosal edema or nasal septal hematoma. Right sinus exhibits no maxillary sinus tenderness and no frontal sinus tenderness. Left sinus exhibits no maxillary sinus tenderness and no frontal sinus tenderness.  Mouth/Throat: Oropharynx is clear and moist and mucous membranes are normal.  Right nostril erythema and a clear crust on septum,no perforation appreciated.  It is tender upon palpation, no active bleeding. Left nostril also with some mild erythema on septum, no ulcer, trace of blood noted but no active bleeding.  Eyes: Pupils are equal, round, and reactive to light. Conjunctivae are normal.  Cardiovascular: Normal rate and regular rhythm.  No murmur heard. Pulses:      Dorsalis pedis pulses are 2+ on the right side, and 2+ on the left side.       Posterior tibial pulses are 2+ on the right side.  Holman's sign negative right.   Respiratory: Effort normal and breath sounds normal. No respiratory distress.  Musculoskeletal: She exhibits edema (Trace pitting edema RLE.).       Right knee: She exhibits normal range of motion. No tenderness found.       Right ankle: She exhibits normal range of motion, no deformity and normal pulse. No tenderness.       Right lower leg: She exhibits no tenderness and no edema.  Mild tenderness upon palpation on trace pitting edema on distal pretibial area of right lower extremity. No ecchymosis or erythema.  Lymphadenopathy:    She has no cervical adenopathy.    Neurological: She is alert and oriented to person, place, and time. She has normal strength. Gait normal.  Skin: Skin is warm. No ecchymosis and no rash noted. No erythema.  Psychiatric: She has a normal mood and affect.  Well groomed, good eye contact.      ASSESSMENT AND PLAN:   Ms. Lori Cunningham was seen today for brusing.  Diagnoses and all orders for this visit:  Pain of right anterior lower extremity We discussed possible etiologies, history suggest traumatic etiology. I do not think imaging or further studies are needed today. She was clearly instructed about warning signs.  Nasal septal ulcer  Could be related with dry nasal mucosa/rhinitis. Recommend saline nasal irrigations as needed. Stop antihistaminic for now. Antibiotic ointment twice daily for 7 days. Follow-up with PCP if not resolved in 3 to 4 weeks.  -     mupirocin ointment (BACTROBAN) 2 %; Apply 1 application topically 2 (two) times daily for 7 days. Right nostril.  Sprain of right lower leg, initial encounter Pain is improving. Extremity elevation. OTC IcyHot or Aspercreme on affected area may help. Recommend holding on running again until pain resolved. Follow-up as needed.      Return if symptoms worsen or fail to improve.        Betty G. Martinique, MD  [facility]. [place] office.

## 2018-03-23 NOTE — Patient Instructions (Signed)
A few things to remember from today's visit:   Nasal septal ulcer - Plan: mupirocin ointment (BACTROBAN) 2 %  Nasal irrigations with saline as needed. Stop antihistaminics for now.   Monitor shin pain and follow if pain or swelling gets worse.   Please be sure medication list is accurate. If a new problem present, please set up appointment sooner than planned today.

## 2018-03-31 ENCOUNTER — Ambulatory Visit: Payer: BLUE CROSS/BLUE SHIELD | Admitting: Psychiatry

## 2018-03-31 DIAGNOSIS — F331 Major depressive disorder, recurrent, moderate: Secondary | ICD-10-CM | POA: Diagnosis not present

## 2018-03-31 DIAGNOSIS — F411 Generalized anxiety disorder: Secondary | ICD-10-CM

## 2018-03-31 NOTE — Progress Notes (Signed)
[facility]  Patient ID: Lori Cunningham,  MRN: [id number]  PCP: Burnis Medin, MD  Date of Evaluation: 03/31/2018 Time spent: 30 minutes  Chief Complaint:   HISTORY/CURRENT STATUS: HPI patient last seen April 2015.  Her diagnoses were anxiety and depression.  She was doing well at the time. She has continued to do well overall.  Has noticed several months of episodic depression and anxiety.  She personally is under a lot of stress.  Individual Medical History/ Review of Systems: Changes? :  Patient denies manic symptoms denies OCD, denies eating disorder, denies psychosis.  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  Thyroid she is hypothyroid.  She has low testosterone and low vitamin D.  Allergies: Trazodone and nefazodone  Current Medications:  Current Outpatient Medications:  .  docusate sodium (COLACE) 100 MG capsule, Take by mouth. Take 2 by mouth at bedtime , Disp: , Rfl:  .  estradiol (VIVELLE-DOT) 0.05 MG/24HR patch, , Disp: , Rfl: 0 .  montelukast (SINGULAIR) 10 MG tablet, Take 10 mg by mouth at bedtime., Disp: , Rfl:  .  NATURE-THROID 195 MG tablet, , Disp: , Rfl: 1 .  NONFORMULARY OR COMPOUNDED ITEM, 3 application daily. Estriol/testosterone HRT 0.6-2 mg/GM, Disp: , Rfl:  .  Albuterol Sulfate (PROAIR RESPICLICK) 616 (90 BASE) MCG/ACT AEPB, Inhale 2 Act into the lungs as directed. Pre exercise 15- 30 minutes (Patient not taking: Reported on 03/31/2018), Disp: 1 each, Rfl: 2 .  cefUROXime (CEFTIN) 250 MG tablet, , Disp: , Rfl: 0 .  desonide (DESOWEN) 0.05 % ointment, Apply 1 application topically 2 (two) times daily. (Patient not taking: Reported on 03/31/2018), Disp: 15 g, Rfl: 0 .  loratadine (LORATADINE ALLERGY RELIEF) 10 MG dissolvable tablet, Take 10 mg by mouth daily., Disp: , Rfl:  .  mometasone (ASMANEX 60 METERED DOSES) 220 MCG/INH inhaler, Inhale 1 puff into the lungs daily. (Patient not taking: Reported on 03/31/2018), Disp: 1 Inhaler, Rfl: 3 .   predniSONE (DELTASONE) 20 MG tablet, Take 1 tablet (20 mg total) by mouth 2 (two) times daily with a meal. (Patient not taking: Reported on 03/31/2018), Disp: 10 tablet, Rfl: 0 .  predniSONE (STERAPRED UNI-PAK 21 TAB) 10 MG (21) TBPK tablet, Take 6 tabs by mouth daily  for 2 days, then 5 tabs for 2 days, then 4 tabs for 2 days, then 3 tabs for 2 days, 2 tabs for 2 days, then 1 tab by mouth daily for 2 days (Patient not taking: Reported on 03/31/2018), Disp: 42 tablet, Rfl: 0 .  Progesterone Micronized (PROGESTERONE PO), Take 150 mg on nights 7 thru 14 of cycle and 300 mg on nights 15 thru 28, Disp: , Rfl:  .  valACYclovir (VALTREX) 1000 MG tablet, Take 1 tablet (1,000 mg total) by mouth 3 (three) times daily. (Patient not taking: Reported on 03/31/2018), Disp: 21 tablet, Rfl: 0   Medication Side Effects: none  meds include thyroid, estrodiol patch, testosterone, vit d, vit c,vit a,tumeric, 5htp, dhea, singulair.  Family Medical/ Social History: Changes? 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  General Appearance: Casual  Eye Contact:  Good  Speech:  Clear and Coherent  Volume:  Normal  Mood:  Euthymic  Affect:  Appropriate  Thought Process:  Goal Directed  Orientation:  Full (Time, Place, and Person)  Thought Content: Logical   Suicidal Thoughts:  No  Homicidal Thoughts:  No  Memory:  WNL  Judgement:  Good  Insight:  Good  Psychomotor Activity:  Normal  Concentration:  Concentration: Good  Recall:  Good  Fund of Knowledge: Good  Language: Good  Assets:  Desire for Improvement  ADL's:  Intact  Cognition: WNL  Prognosis:  Good    DIAGNOSES:    ICD-10-CM   1. Major depressive disorder, recurrent episode, moderate (HCC) F33.1   2. Anxiety state F41.1     Receiving Psychotherapy: No    RECOMMENDATIONS: pt desires to start Wellbutrin xl,  Discussed possible increase anxiety on Wellbutrin.  Start at  150/day for 1 week, then 300/day.Madaline Brilliant to continue supplements Recheck 6 weeks   Comer Locket, [place]

## 2018-04-23 ENCOUNTER — Encounter: Payer: Self-pay | Admitting: Family Medicine

## 2018-04-23 ENCOUNTER — Ambulatory Visit: Payer: BLUE CROSS/BLUE SHIELD | Admitting: Family Medicine

## 2018-04-23 VITALS — BP 128/88 | HR 71 | Temp 98.6°F | Ht 63.0 in | Wt 181.8 lb

## 2018-04-23 DIAGNOSIS — J40 Bronchitis, not specified as acute or chronic: Secondary | ICD-10-CM

## 2018-04-23 DIAGNOSIS — J0140 Acute pansinusitis, unspecified: Secondary | ICD-10-CM

## 2018-04-23 MED ORDER — AZITHROMYCIN 250 MG PO TABS: ORAL_TABLET | ORAL | 0 refills | Status: AC

## 2018-04-23 MED ORDER — PROMETHAZINE-DM 6.25-15 MG/5ML PO SYRP: 5.0000 mL | ORAL_SOLUTION | Freq: Four times a day (QID) | ORAL | 0 refills | Status: AC | PRN

## 2018-04-23 NOTE — Progress Notes (Signed)
Patient ID: Lori Cunningham, female    DOB: 1965/11/09  Age: 52 y.o. MRN: [id number]    Subjective:  Subjective  HPI HODA HON presents for cough that makes her gag.  Cough is keeping her awake --- nyquil help.   No fevers.   + productive cough and + sinus pressure   Review of Systems  Constitutional: Negative for chills and fever.  HENT: Positive for congestion, postnasal drip, rhinorrhea, sinus pressure and sinus pain.   Respiratory: Positive for cough, chest tightness, shortness of breath and wheezing.   Cardiovascular: Negative for chest pain, palpitations and leg swelling.  Allergic/Immunologic: Negative for environmental allergies.    History Past Medical History:  Diagnosis Date  . Allergic rhinitis   . Anal fissure 12/03/2007  . Bronchiectasis    ill from childhood pneumonia? had broch in about 1999, pft's WNL 04/12/09  . Depression    DENIES 11/12/2015  . DYSPNEA 02/12/2009  . Eczema   . GERD (gastroesophageal reflux disease)   . Headache(784.0) 10/16/2008  . Hemorrhage of rectum and anus 10/06/2007  . HPV test positive   . Hypothyroidism   . Myalgia and myositis, unspecified 09/2010   Panosh  . Nontoxic multinodular goiter 07/03/10   [name]  . PALPITATIONS, RECURRENT 03/22/2007  . Periorbital swelling with pruritis 09/17/2012   no dx  prob allergic but context and hx  noevidence of infection    . Plantar fasciitis   . PREMATURE VENTRICULAR CONTRACTIONS 05/21/2010  . Sleep disturbance     She has a past surgical history that includes Carpal tunnel release (Bilateral); Cesarean section; Nasal turbinate reduction; Bronchoscopy; and Colonoscopy.   Her family history includes Aneurysm in her paternal uncle; Diabetes in her mother; Heart disease in her maternal grandfather; Hyperlipidemia in her mother; Other in her father.She reports that she has quit smoking. Her smoking use included cigarettes. She has never used smokeless tobacco. She reports that she drinks  about 2.0 standard drinks of alcohol per week. She reports that she does not use drugs.  Current Outpatient Medications on File Prior to Visit  Medication Sig Dispense Refill  . desonide (DESOWEN) 0.05 % ointment Apply 1 application topically 2 (two) times daily. 15 g 0  . docusate sodium (COLACE) 100 MG capsule Take by mouth. Take 2 by mouth at bedtime     . estradiol (VIVELLE-DOT) 0.05 MG/24HR patch   0  . montelukast (SINGULAIR) 10 MG tablet Take 10 mg by mouth at bedtime.    Marland Kitchen NATURE-THROID 195 MG tablet   1  . NONFORMULARY OR COMPOUNDED ITEM 3 application daily. Estriol/testosterone HRT 0.6-2 mg/GM    . Progesterone Micronized (PROGESTERONE PO) Take 150 mg on nights 7 thru 14 of cycle and 300 mg on nights 15 thru 28    . valACYclovir (VALTREX) 1000 MG tablet Take 1 tablet (1,000 mg total) by mouth 3 (three) times daily. 21 tablet 0   No current facility-administered medications on file prior to visit.      Objective:  Objective  Physical Exam  Constitutional: She is oriented to person, place, and time. She appears well-developed and well-nourished.  HENT:  Right Ear: External ear normal.  Left Ear: External ear normal.  Nose: Right sinus exhibits maxillary sinus tenderness and frontal sinus tenderness. Left sinus exhibits maxillary sinus tenderness and frontal sinus tenderness.  + PND + errythema  Eyes: Conjunctivae are normal. Right eye exhibits no discharge. Left eye exhibits no discharge.  Cardiovascular: Normal rate, regular rhythm and  normal heart sounds.  No murmur heard. Pulmonary/Chest: Effort normal and breath sounds normal. No respiratory distress. She has no wheezes. She has no rales. She exhibits no tenderness.  Musculoskeletal: She exhibits no edema.  Lymphadenopathy:    She has cervical adenopathy.  Neurological: She is alert and oriented to person, place, and time.  Nursing note and vitals reviewed.  BP 128/88 (BP Location: Left Arm, Patient Position: Sitting,  Cuff Size: Large)   Pulse 71   Temp 98.6 F (37 C) (Oral)   Ht 5\' 3"  (1.6 m)   Wt 181 lb 12 oz (82.4 kg)   LMP 11/13/2012   SpO2 97%   BMI 32.20 kg/m  Wt Readings from Last 3 Encounters:  04/23/18 181 lb 12 oz (82.4 kg)  03/23/18 184 lb 9.6 oz (83.7 kg)  12/14/17 177 lb 3.2 oz (80.4 kg)     Lab Results  Component Value Date   WBC 4.8 05/16/2017   HGB 14.3 05/16/2017   HCT 42.0 05/16/2017   PLT 284 05/16/2017   GLUCOSE 101 (H) 05/16/2017   CHOL 161 09/21/2013   TRIG 28.0 09/21/2013   HDL 79.80 09/21/2013   LDLCALC 76 09/21/2013   ALT 27 05/16/2017   AST 27 05/16/2017   NA 141 05/16/2017   K 4.4 05/16/2017   CL 104 05/16/2017   CREATININE 0.60 05/16/2017   BUN 17 05/16/2017   CO2 25 05/16/2017   TSH 0.00 (L) 09/21/2013   INR 0.92 05/16/2017    Mm Digital Screening Bilateral  Result Date: 09/21/2017 CLINICAL DATA:  Screening. EXAM: DIGITAL SCREENING BILATERAL MAMMOGRAM WITH CAD COMPARISON:  Previous exam(s). ACR Breast Density Category a: The breast tissue is almost entirely fatty. FINDINGS: There are no findings suspicious for malignancy. Images were processed with CAD. IMPRESSION: No mammographic evidence of malignancy. A result letter of this screening mammogram will be mailed directly to the patient. RECOMMENDATION: Screening mammogram in one year. (Code:SM-B-01Y) BI-RADS CATEGORY  1: Negative. Electronically Signed   By: Ammie Ferrier M.D.   On: 09/21/2017 16:53     Assessment & Plan:  Plan  I have discontinued Jonah H. Perea's Albuterol Sulfate, mometasone, loratadine, cefUROXime, predniSONE, and predniSONE. I am also having her start on azithromycin and promethazine-dextromethorphan. Additionally, I am having her maintain her docusate sodium, Progesterone Micronized (PROGESTERONE PO), NONFORMULARY OR COMPOUNDED ITEM, estradiol, NATURE-THROID, montelukast, valACYclovir, and desonide.  Meds ordered this encounter  Medications  . azithromycin (ZITHROMAX  Z-PAK) 250 MG tablet    Sig: As directied    Dispense:  6 each    Refill:  0  . promethazine-dextromethorphan (PROMETHAZINE-DM) 6.25-15 MG/5ML syrup    Sig: Take 5 mLs by mouth 4 (four) times daily as needed.    Dispense:  118 mL    Refill:  0    Problem List Items Addressed This Visit    None    Visit Diagnoses    Acute pansinusitis, recurrence not specified    -  Primary   Relevant Medications   azithromycin (ZITHROMAX Z-PAK) 250 MG tablet   promethazine-dextromethorphan (PROMETHAZINE-DM) 6.25-15 MG/5ML syrup   Bronchitis       Relevant Medications   azithromycin (ZITHROMAX Z-PAK) 250 MG tablet   promethazine-dextromethorphan (PROMETHAZINE-DM) 6.25-15 MG/5ML syrup      Follow-up: Return if symptoms worsen or fail to improve.  Ann Held, DO

## 2018-04-23 NOTE — Patient Instructions (Signed)

## 2018-05-20 ENCOUNTER — Other Ambulatory Visit: Payer: Self-pay | Admitting: Psychiatry

## 2018-05-21 ENCOUNTER — Other Ambulatory Visit: Payer: Self-pay

## 2018-05-21 NOTE — Telephone Encounter (Signed)
Verifying pharmacy

## 2018-05-21 NOTE — Telephone Encounter (Signed)
Need to review paper chart  

## 2018-05-24 ENCOUNTER — Ambulatory Visit: Payer: BLUE CROSS/BLUE SHIELD | Admitting: Psychiatry

## 2018-05-24 DIAGNOSIS — F411 Generalized anxiety disorder: Secondary | ICD-10-CM

## 2018-05-24 DIAGNOSIS — F331 Major depressive disorder, recurrent, moderate: Secondary | ICD-10-CM

## 2018-05-24 MED ORDER — ALPRAZOLAM 0.25 MG PO TABS: ORAL_TABLET | ORAL | 2 refills | Status: DC

## 2018-05-24 MED ORDER — BUPROPION HCL ER (XL) 300 MG PO TB24: 300.0000 mg | ORAL_TABLET | Freq: Every day | ORAL | 2 refills | Status: DC

## 2018-05-24 NOTE — Progress Notes (Signed)
[facility]  Patient ID: Lori Cunningham,  MRN: [id number]  PCP: Burnis Medin, MD  Date of Evaluation: 05/24/2018 Time spent:20 minutes  Chief Complaint:   HISTORY/CURRENT STATUS: HPI patient seen 03/31/2018.  Prior To that is been several years since she had been seen.  She was diagnosed with anxiety and depression.  Patient requested Wellbutrin even knowing that increases anxiety.  Started her on Wellbutrin XL 150 a day for a week and then 300 a day. Continues to do well.  Individual Medical History/ Review of Systems: Changes? :No   Allergies: Trazodone and nefazodone  Current Medications:  Current Outpatient Medications:  .  ALPRAZolam (XANAX) 0.25 MG tablet, TAKE 1 TO 2 TABLETS BY MOUTH DAILY AS NEEDED FOR ANXIETY, Disp: 30 tablet, Rfl: 2 .  buPROPion (WELLBUTRIN XL) 300 MG 24 hr tablet, Take 1 tablet (300 mg total) by mouth daily., Disp: 30 tablet, Rfl: 2 .  azithromycin (ZITHROMAX Z-PAK) 250 MG tablet, As directied, Disp: 6 each, Rfl: 0 .  desonide (DESOWEN) 0.05 % ointment, Apply 1 application topically 2 (two) times daily., Disp: 15 g, Rfl: 0 .  docusate sodium (COLACE) 100 MG capsule, Take by mouth. Take 2 by mouth at bedtime , Disp: , Rfl:  .  estradiol (VIVELLE-DOT) 0.05 MG/24HR patch, , Disp: , Rfl: 0 .  montelukast (SINGULAIR) 10 MG tablet, Take 10 mg by mouth at bedtime., Disp: , Rfl:  .  NATURE-THROID 195 MG tablet, , Disp: , Rfl: 1 .  NONFORMULARY OR COMPOUNDED ITEM, 3 application daily. Estriol/testosterone HRT 0.6-2 mg/GM, Disp: , Rfl:  .  Progesterone Micronized (PROGESTERONE PO), Take 150 mg on nights 7 thru 14 of cycle and 300 mg on nights 15 thru 28, Disp: , Rfl:  .  promethazine-dextromethorphan (PROMETHAZINE-DM) 6.25-15 MG/5ML syrup, Take 5 mLs by mouth 4 (four) times daily as needed., Disp: 118 mL, Rfl: 0 .  valACYclovir (VALTREX) 1000 MG tablet, Take 1 tablet (1,000 mg total) by mouth 3 (three) times daily., Disp: 21 tablet, Rfl:  0 Medication Side Effects: none  Family Medical/ Social History: Changes? No  MENTAL HEALTH EXAM:  Last menstrual period 11/13/2012.There is no height or weight on file to calculate BMI.  General Appearance: Casual  Eye Contact:  Good  Speech:  Clear and Coherent  Volume:  Normal  Mood:  Euthymic  Affect:  Appropriate  Thought Process:  Linear  Orientation:  Full (Time, Place, and Person)  Thought Content: Logical   Suicidal Thoughts:  No  Homicidal Thoughts:  No  Memory:  WNL  Judgement:  Good  Insight:  Good  Psychomotor Activity:  Normal  Concentration:  Concentration: Good  Recall:  Good  Fund of Knowledge: Good  Language: Good  Assets:  Social Support  ADL's:  Intact  Cognition: WNL  Prognosis:  Good    DIAGNOSES:    ICD-10-CM   1. Major depressive disorder, recurrent episode, moderate (HCC) F33.1   2. Anxiety state F41.1     Receiving Psychotherapy: No    RECOMMENDATIONS: no change. See in 3 months.   Comer Locket, PA-C

## 2018-06-11 ENCOUNTER — Ambulatory Visit: Payer: BLUE CROSS/BLUE SHIELD | Admitting: Psychiatry

## 2018-06-18 ENCOUNTER — Ambulatory Visit: Payer: BLUE CROSS/BLUE SHIELD | Admitting: Psychiatry

## 2018-06-25 ENCOUNTER — Ambulatory Visit: Payer: BLUE CROSS/BLUE SHIELD | Admitting: Psychiatry

## 2018-08-21 ENCOUNTER — Other Ambulatory Visit: Payer: Self-pay | Admitting: Psychiatry

## 2018-08-23 ENCOUNTER — Ambulatory Visit: Payer: BLUE CROSS/BLUE SHIELD | Admitting: Psychiatry

## 2018-09-06 ENCOUNTER — Ambulatory Visit: Payer: BLUE CROSS/BLUE SHIELD | Admitting: Psychiatry

## 2018-09-10 ENCOUNTER — Other Ambulatory Visit: Payer: Self-pay

## 2018-09-10 ENCOUNTER — Ambulatory Visit (INDEPENDENT_AMBULATORY_CARE_PROVIDER_SITE_OTHER): Payer: BLUE CROSS/BLUE SHIELD | Admitting: Physician Assistant

## 2018-09-10 ENCOUNTER — Encounter: Payer: Self-pay | Admitting: Physician Assistant

## 2018-09-10 DIAGNOSIS — F331 Major depressive disorder, recurrent, moderate: Secondary | ICD-10-CM | POA: Diagnosis not present

## 2018-09-10 DIAGNOSIS — F411 Generalized anxiety disorder: Secondary | ICD-10-CM | POA: Diagnosis not present

## 2018-09-10 MED ORDER — BUPROPION HCL ER (XL) 300 MG PO TB24: 300.0000 mg | ORAL_TABLET | Freq: Every day | ORAL | 1 refills | Status: DC

## 2018-09-10 MED ORDER — ALPRAZOLAM 0.25 MG PO TABS: ORAL_TABLET | ORAL | 5 refills | Status: DC

## 2018-09-10 NOTE — Progress Notes (Signed)
[facility]  Patient ID: Lori Cunningham,  MRN: [id number]  PCP: Burnis Medin, MD  Date of Evaluation: 09/10/2018 Time spent:15 minutes  Chief Complaint:  Chief Complaint    Follow-up     Virtual Visit via Telephone Note  I connected with Lori Cunningham on 09/10/18 at  1:00 PM EDT by telephone and verified that I am speaking with the correct person using two identifiers.   I discussed the limitations, risks, security and privacy concerns of performing an evaluation and management service by telephone and the availability of in person appointments. I also discussed with the patient that there may be a patient responsible charge related to this service. The patient expressed understanding and agreed to proceed.    HISTORY/CURRENT STATUS: HPI  For routine med check.    Patient denies loss of interest in usual activities and is able to enjoy things.  Denies decreased energy or motivation.  Appetite has not changed.  No extreme sadness, tearfulness, or feelings of hopelessness.  Denies any changes in concentration, making decisions or remembering things.  Denies suicidal or homicidal thoughts.  Anxiety is much better.  Does not need the Xanax every day but it does help when she needs it.  She feels like the anxiety is even better because of the Wellbutrin, that has not made it worse.  She takes the Wellbutrin at night because it has made her sleepy in the past.  She sleeps well.  Denies muscle or joint pain, stiffness, or dystonia.  Denies dizziness, syncope, seizures, numbness, tingling, tremor, tics, unsteady gait, slurred speech, confusion.   Individual Medical History/ Review of Systems: Changes? 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  Allergies: Trazodone and nefazodone  Current Medications:  Current Outpatient Medications:  .  ALPRAZolam (XANAX) 0.25 MG tablet, TAKE 1 TO 2 TABLETS BY MOUTH ONCE DAILY AS  NEEDED FOR ANXIETY, Disp: 60 tablet, Rfl: 5 .  buPROPion (WELLBUTRIN XL) 300 MG 24 hr tablet, Take 1 tablet (300 mg total) by mouth daily., Disp: 90 tablet, Rfl: 1 .  docusate sodium (COLACE) 100 MG capsule, Take by mouth. Take 2 by mouth at bedtime , Disp: , Rfl:  .  montelukast (SINGULAIR) 10 MG tablet, Take 10 mg by mouth at bedtime., Disp: , Rfl:  .  NATURE-THROID 195 MG tablet, , Disp: , Rfl: 1 .  azithromycin (ZITHROMAX Z-PAK) 250 MG tablet, As directied (Patient not taking: Reported on 09/10/2018), Disp: 6 each, Rfl: 0 .  desonide (DESOWEN) 0.05 % ointment, Apply 1 application topically 2 (two) times daily. (Patient not taking: Reported on 09/10/2018), Disp: 15 g, Rfl: 0 .  estradiol (VIVELLE-DOT) 0.05 MG/24HR patch, , Disp: , Rfl: 0 .  NONFORMULARY OR COMPOUNDED ITEM, 3 application daily. Estriol/testosterone HRT 0.6-2 mg/GM, Disp: , Rfl:  .  Progesterone Micronized (PROGESTERONE PO), Take 150 mg on nights 7 thru 14 of cycle and 300 mg on nights 15 thru 28, Disp: , Rfl:  .  promethazine-dextromethorphan (PROMETHAZINE-DM) 6.25-15 MG/5ML syrup, Take 5 mLs by mouth 4 (four) times daily as needed. (Patient not taking: Reported on 09/10/2018), Disp: 118 mL, Rfl: 0 .  valACYclovir (VALTREX) 1000 MG tablet, Take 1 tablet (1,000 mg total) by mouth 3 (three) times daily. (Patient not taking: Reported on 09/10/2018), Disp: 21 tablet, Rfl: 0 Medication Side Effects: none  Family Medical/ Social History: Changes? 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  MENTAL HEALTH EXAM:  Last menstrual period 11/13/2012.There is no height or weight on file to calculate BMI.  General Appearance: Phone visit, unable to assess  Eye Contact:  Unable to assess  Speech:  Clear and Coherent  Volume:  Normal  Mood:  Euthymic  Affect:  Unable to assess  Thought Process:  Goal Directed  Orientation:  Full (Time, Place, and Person)  Thought Content: Logical   Suicidal  Thoughts:  No  Homicidal Thoughts:  No  Memory:  WNL  Judgement:  Good  Insight:  Good  Psychomotor Activity:  Unable to assess  Concentration:  Concentration: Good  Recall:  Good  Fund of Knowledge: Good  Language: Good  Assets:  Desire for Improvement  ADL's:  Intact  Cognition: WNL  Prognosis:  Good    DIAGNOSES:    ICD-10-CM   1. Major depressive disorder, recurrent episode, moderate (HCC) F33.1   2. Generalized anxiety disorder F41.1     Receiving Psychotherapy: No    RECOMMENDATIONS: I am glad to see her doing so well! Continue Wellbutrin XL 300 mg p.o. daily. Continue Xanax 0.25 mg 1-2 twice daily as needed anxiety. Return in 6 months.   Donnal Moat, PA-C   This record has been created using [employer].  Chart creation errors have been sought, but may not always have been located and corrected. Such creation errors do not reflect on the standard of medical care.

## 2019-02-21 ENCOUNTER — Other Ambulatory Visit: Payer: Self-pay | Admitting: Internal Medicine

## 2019-03-02 ENCOUNTER — Other Ambulatory Visit: Payer: Self-pay | Admitting: Physician Assistant

## 2019-03-11 ENCOUNTER — Other Ambulatory Visit: Payer: Self-pay | Admitting: Physician Assistant

## 2019-03-14 NOTE — Telephone Encounter (Signed)
Due back this month for appt, nothing scheduled yet

## 2019-03-15 ENCOUNTER — Telehealth: Payer: Self-pay | Admitting: Physician Assistant

## 2019-03-15 NOTE — Telephone Encounter (Signed)
Angie please have her sched appt.  Once she does, let me know and I'll ok this RF.  Thanks

## 2019-03-15 NOTE — Telephone Encounter (Signed)
Pt would like a refill on alprazolam sent to [facility] on [address].

## 2019-03-16 ENCOUNTER — Other Ambulatory Visit: Payer: Self-pay

## 2019-03-21 ENCOUNTER — Other Ambulatory Visit: Payer: Self-pay | Admitting: Physician Assistant

## 2019-03-24 NOTE — Telephone Encounter (Signed)
Lm for pt to call to schedule her follow up

## 2019-04-11 ENCOUNTER — Other Ambulatory Visit: Payer: Self-pay

## 2019-04-11 MED ORDER — ALPRAZOLAM 0.25 MG PO TABS: ORAL_TABLET | ORAL | 5 refills | Status: DC

## 2019-05-30 ENCOUNTER — Ambulatory Visit (INDEPENDENT_AMBULATORY_CARE_PROVIDER_SITE_OTHER): Payer: BLUE CROSS/BLUE SHIELD | Admitting: Physician Assistant

## 2019-05-30 ENCOUNTER — Encounter: Payer: Self-pay | Admitting: Physician Assistant

## 2019-05-30 DIAGNOSIS — F411 Generalized anxiety disorder: Secondary | ICD-10-CM

## 2019-05-30 DIAGNOSIS — F320 Major depressive disorder, single episode, mild: Secondary | ICD-10-CM | POA: Diagnosis not present

## 2019-05-30 MED ORDER — BUPROPION HCL ER (XL) 300 MG PO TB24: 300.0000 mg | ORAL_TABLET | Freq: Every day | ORAL | 1 refills | Status: DC

## 2019-05-30 NOTE — Progress Notes (Signed)
[facility]  Patient ID: Lori Cunningham,  MRN: [id number]  PCP: Burnis Medin, MD  Date of Evaluation: 05/30/2019 Time spent:15 minutes  Chief Complaint:  Chief Complaint    Anxiety; Follow-up     Virtual Visit via Telephone Note  I connected with patient by a video enabled telemedicine application or telephone, with their informed consent, and verified patient privacy and that I am speaking with the correct person using two identifiers.  I am private, in my office and the patient is home.   I discussed the limitations, risks, security and privacy concerns of performing an evaluation and management service by telephone and the availability of in person appointments. I also discussed with the patient that there may be a patient responsible charge related to this service. The patient expressed understanding and agreed to proceed.   I discussed the assessment and treatment plan with the patient. The patient was provided an opportunity to ask questions and all were answered. The patient agreed with the plan and demonstrated an understanding of the instructions.   The patient was advised to call back or seek an in-person evaluation if the symptoms worsen or if the condition fails to improve as anticipated.  I provided 15 minutes of non-face-to-face time during this encounter.  HISTORY/CURRENT STATUS: HPI for 44-month med check.  Patient states she is doing really well.  She feels that the meds are working well.  Her mood is good.  She is able to enjoy things.  Energy and motivation are good.  She sleeps well.  Denies suicidal or homicidal thoughts.  Anxiety is well controlled.  She does not usually have to take the Xanax during the day.  Usually gets in the early evening and at bedtime when she gets more anxious or has racing thoughts.  She then needs to take the Xanax to stop those racing thoughts and to be able to fall asleep.  Denies dizziness, syncope, seizures,  numbness, tingling, tremor, tics, unsteady gait, slurred speech, confusion. Denies muscle or joint pain, stiffness, or dystonia.  Individual Medical History/ Review of Systems: Changes? :Yes She is having symptoms of esophageal reflux.  Has an upper endoscopy scheduled soon.  Past medications for mental health diagnoses include: Trazodone has caused PVCs.  Allergies: Trazodone and nefazodone  Current Medications:  Current Outpatient Medications:  .  ALPRAZolam (XANAX) 0.25 MG tablet, TAKE 1 TO 2 TABLETS BY MOUTH ONCE DAILY AS NEEDED FOR ANXIETY, Disp: 60 tablet, Rfl: 5 .  buPROPion (WELLBUTRIN XL) 300 MG 24 hr tablet, Take 1 tablet (300 mg total) by mouth daily., Disp: 90 tablet, Rfl: 1 .  docusate sodium (COLACE) 100 MG capsule, Take by mouth. Take 2 by mouth at bedtime , Disp: , Rfl:  .  montelukast (SINGULAIR) 10 MG tablet, Take 10 mg by mouth at bedtime., Disp: , Rfl:  .  NATURE-THROID 195 MG tablet, , Disp: , Rfl: 1 .  azithromycin (ZITHROMAX Z-PAK) 250 MG tablet, As directied (Patient not taking: Reported on 09/10/2018), Disp: 6 each, Rfl: 0 .  desonide (DESOWEN) 0.05 % ointment, Apply 1 application topically 2 (two) times daily. (Patient not taking: Reported on 09/10/2018), Disp: 15 g, Rfl: 0 .  estradiol (VIVELLE-DOT) 0.05 MG/24HR patch, , Disp: , Rfl: 0 .  NONFORMULARY OR COMPOUNDED ITEM, 3 application daily. Estriol/testosterone HRT 0.6-2 mg/GM, Disp: , Rfl:  .  Progesterone Micronized (PROGESTERONE PO), Take 150 mg on nights 7 thru 14 of cycle and 300 mg on nights 15 thru  28, Disp: , Rfl:  .  promethazine-dextromethorphan (PROMETHAZINE-DM) 6.25-15 MG/5ML syrup, Take 5 mLs by mouth 4 (four) times daily as needed. (Patient not taking: Reported on 09/10/2018), Disp: 118 mL, Rfl: 0 .  valACYclovir (VALTREX) 1000 MG tablet, Take 1 tablet (1,000 mg total) by mouth 3 (three) times daily. (Patient not taking: Reported on 09/10/2018), Disp: 21 tablet, Rfl: 0 Medication Side Effects:  none  Family Medical/ Social History: Changes? No  MENTAL HEALTH EXAM:  Last menstrual period 11/13/2012.There is no height or weight on file to calculate BMI.  General Appearance: unable to assess  Eye Contact:  unable to assess  Speech:  Clear and Coherent  Volume:  Normal  Mood:  Euthymic  Affect:  unable to assess  Thought Process:  Goal Directed and Descriptions of Associations: Intact  Orientation:  Full (Time, Place, and Person)  Thought Content: Logical   Suicidal Thoughts:  No  Homicidal Thoughts:  No  Memory:  WNL  Judgement:  Good  Insight:  Good  Psychomotor Activity:  unable to assess  Concentration:  Concentration: Good  Recall:  Good  Fund of Knowledge: Good  Language: Good  Assets:  Desire for Improvement  ADL's:  Intact  Cognition: WNL  Prognosis:  Good    DIAGNOSES:    ICD-10-CM   1. Major depressive disorder, single episode, mild (HCC)  F32.0   2. Generalized anxiety disorder  F41.1     Receiving Psychotherapy: No    RECOMMENDATIONS:  I am glad to see her doing so well! Continue Xanax 0.25 mg, 1/2-1 twice daily as needed. Continue Wellbutrin XL 300 mg daily. Return in 6 months.  Donnal Moat, PA-C

## 2020-01-06 ENCOUNTER — Other Ambulatory Visit: Payer: Self-pay | Admitting: Physician Assistant

## 2020-01-31 ENCOUNTER — Other Ambulatory Visit: Payer: Self-pay | Admitting: Physician Assistant

## 2020-02-01 NOTE — Telephone Encounter (Signed)
Last apt 05/2019 due back 6 months

## 2020-04-16 ENCOUNTER — Telehealth: Payer: Self-pay | Admitting: Physician Assistant

## 2020-04-16 NOTE — Telephone Encounter (Signed)
Korrine called to request refill of her wellbutrin XL.  She will run out before appt 12/8.  Switching pharmacies so couldn't have pharmacy send request.  Please send to [facility], [facility].  Remove the [facility] as a preferred pharmacy.

## 2020-04-17 ENCOUNTER — Other Ambulatory Visit: Payer: Self-pay

## 2020-04-17 MED ORDER — BUPROPION HCL ER (XL) 300 MG PO TB24
300.0000 mg | ORAL_TABLET | Freq: Every day | ORAL | 0 refills | Status: DC
Start: 2020-04-17 — End: 2020-07-12

## 2020-04-17 NOTE — Telephone Encounter (Signed)
Rx sent and Pharmacy updated

## 2020-05-02 ENCOUNTER — Telehealth: Payer: Self-pay | Admitting: Physician Assistant

## 2020-05-02 ENCOUNTER — Telehealth (INDEPENDENT_AMBULATORY_CARE_PROVIDER_SITE_OTHER): Payer: BLUE CROSS/BLUE SHIELD | Admitting: Physician Assistant

## 2020-05-02 ENCOUNTER — Encounter: Payer: Self-pay | Admitting: Physician Assistant

## 2020-05-02 DIAGNOSIS — F330 Major depressive disorder, recurrent, mild: Secondary | ICD-10-CM | POA: Diagnosis not present

## 2020-05-02 DIAGNOSIS — F411 Generalized anxiety disorder: Secondary | ICD-10-CM

## 2020-05-02 MED ORDER — BUPROPION HCL ER (XL) 150 MG PO TB24: 450.0000 mg | ORAL_TABLET | Freq: Every day | ORAL | 1 refills | Status: DC

## 2020-05-02 MED ORDER — ALPRAZOLAM 0.25 MG PO TABS: ORAL_TABLET | ORAL | 5 refills | Status: DC

## 2020-05-02 NOTE — Progress Notes (Signed)
[facility]  Patient ID: Lori Cunningham,  MRN: [id number]  PCP: Burnis Medin, MD  Date of Evaluation: 05/02/2020 Time spent:20 minutes  Chief Complaint:  Chief Complaint    Depression; Anxiety     Virtual Visit via Telehealth  I connected with patient by telephone, with their informed consent, and verified patient privacy and that I am speaking with the correct person using two identifiers.  I am private, in my office and the patient is at home.  I discussed the limitations, risks, security and privacy concerns of performing an evaluation and management service by telephone and the availability of in person appointments. I also discussed with the patient that there may be a patient responsible charge related to this service. The patient expressed understanding and agreed to proceed.   I discussed the assessment and treatment plan with the patient. The patient was provided an opportunity to ask questions and all were answered. The patient agreed with the plan and demonstrated an understanding of the instructions.   The patient was advised to call back or seek an in-person evaluation if the symptoms worsen or if the condition fails to improve as anticipated.  I provided 20 minutes of non-face-to-face time during this encounter.  HISTORY/CURRENT STATUS: HPI for routine med check.  Lori Cunningham has not been seen in almost a year.  Our office is out of network, she has to pay out-of-pocket, she has not been able to come in for those reasons.   States that for the most part she is doing well.  But there are some things going on in her personal life that make her feel like she could be on the brink of depression worsening.  She is able to work, and enjoy things when she gets the chance.  She is not crying easily or feeling helpless or hopeless, but feels it may get to that point.  Energy and motivation are good.  She sleeps well.  Denies suicidal or homicidal  thoughts.  Anxiety is well controlled.  She rarely takes the Xanax.  In fact her last prescription was written in November 2020 and refills that she had expired.  Patient denies increased energy with decreased need for sleep, no increased talkativeness, no racing thoughts, no impulsivity or risky behaviors, no increased spending, no increased libido, no grandiosity, no increased irritability or anger, no paranoia, and no hallucinations.  Denies dizziness, syncope, seizures, numbness, tingling, tremor, tics, unsteady gait, slurred speech, confusion. Denies muscle or joint pain, stiffness, or dystonia.  Individual Medical History/ Review of Systems: Changes? 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  Allergies: Trazodone and nefazodone  Current Medications:  Current Outpatient Medications:  .  ALPRAZolam (XANAX) 0.25 MG tablet, TAKE 1 TO 2 TABLETS BY MOUTH ONCE DAILY AS NEEDED FOR ANXIETY, Disp: 60 tablet, Rfl: 5 .  buPROPion (WELLBUTRIN XL) 300 MG 24 hr tablet, Take 1 tablet (300 mg total) by mouth daily., Disp: 90 tablet, Rfl: 0 .  docusate sodium (COLACE) 100 MG capsule, Take by mouth. Take 2 by mouth at bedtime , Disp: , Rfl:  .  NATURE-THROID 195 MG tablet, , Disp: , Rfl: 1 .  TIROSINT 75 MCG CAPS, Take 1 capsule by mouth daily., Disp: , Rfl:  .  azithromycin (ZITHROMAX Z-PAK) 250 MG tablet, As directied (Patient not taking: Reported on 09/10/2018), Disp: 6 each, Rfl: 0 .  buPROPion (WELLBUTRIN XL) 150 MG 24 hr tablet, Take 3 tablets (  450 mg total) by mouth daily., Disp: 270 tablet, Rfl: 1 .  desonide (DESOWEN) 0.05 % ointment, Apply 1 application topically 2 (two) times daily. (Patient not taking: Reported on 09/10/2018), Disp: 15 g, Rfl: 0 .  estradiol (VIVELLE-DOT) 0.05 MG/24HR patch, , Disp: , Rfl: 0 .  montelukast (SINGULAIR) 10 MG tablet, Take 10 mg by mouth at bedtime. (Patient not taking: Reported on 05/02/2020), Disp: , Rfl:  .   NONFORMULARY OR COMPOUNDED ITEM, 3 application daily. Estriol/testosterone HRT 0.6-2 mg/GM (Patient not taking: Reported on 05/02/2020), Disp: , Rfl:  .  Progesterone Micronized (PROGESTERONE PO), Take 150 mg on nights 7 thru 14 of cycle and 300 mg on nights 15 thru 28 (Patient not taking: Reported on 05/02/2020), Disp: , Rfl:  .  promethazine-dextromethorphan (PROMETHAZINE-DM) 6.25-15 MG/5ML syrup, Take 5 mLs by mouth 4 (four) times daily as needed. (Patient not taking: Reported on 09/10/2018), Disp: 118 mL, Rfl: 0 .  valACYclovir (VALTREX) 1000 MG tablet, Take 1 tablet (1,000 mg total) by mouth 3 (three) times daily. (Patient not taking: Reported on 09/10/2018), Disp: 21 tablet, Rfl: 0 Medication Side Effects: none  Family Medical/ Social History: Changes? No  MENTAL HEALTH EXAM:  Last menstrual period 11/13/2012.There is no height or weight on file to calculate BMI.  General Appearance: unable to assess  Eye Contact:  unable to assess  Speech:  Clear and Coherent  Volume:  Normal  Mood:  Euthymic  Affect:  unable to assess  Thought Process:  Goal Directed and Descriptions of Associations: Intact  Orientation:  Full (Time, Place, and Person)  Thought Content: Logical   Suicidal Thoughts:  No  Homicidal Thoughts:  No  Memory:  WNL  Judgement:  Good  Insight:  Good  Psychomotor Activity:  unable to assess  Concentration:  Concentration: Good  Recall:  Good  Fund of Knowledge: Good  Language: Good  Assets:  Desire for Improvement  ADL's:  Intact  Cognition: WNL  Prognosis:  Good    DIAGNOSES:    ICD-10-CM   1. Mild episode of recurrent major depressive disorder (HCC)  F33.0   2. Generalized anxiety disorder  F41.1     Receiving Psychotherapy: No    RECOMMENDATIONS:  PDMP reviewed.   I provided 20 minutes of nonface-to-face time during this encounter. We discussed increasing the Wellbutrin XL to 450 mg.  I agree it is a good idea.  She just got a prescription for 90 days of  the 300 mg so I will add the 150 mg pill to that.  After she finishes the 300 mg dose, then she will start taking 3 of the 150 mg pills.  She understands. Usually, I would ask her to return in approximately 6 weeks, to make sure the medication is working well.  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  States she will definitely let me know if she is not doing well at any time before that appointment. Increase Wellbutrin XL to a total of 450 mg daily. Continue Xanax 0.25 mg, 1/2-1 twice daily as needed. Return in 6 months.  Donnal Moat, PA-C

## 2020-05-02 NOTE — Telephone Encounter (Signed)
Ms. amaliya, [place] are scheduled for a virtual visit with your provider today.    Just as we do with appointments in the office, we must obtain your consent to participate.  Your consent will be active for this visit and any virtual visit you may have with one of our providers in the next 365 days.    If you have a MyChart account, I can also send a copy of this consent to you electronically.  All virtual visits are billed to your insurance company just like a traditional visit in the office.  As this is a virtual visit, video technology does not allow for your provider to perform a traditional examination.  This may limit your provider's ability to fully assess your condition.  If your provider identifies any concerns that need to be evaluated in person or the need to arrange testing such as labs, EKG, etc, we will make arrangements to do so.    Although advances in technology are sophisticated, we cannot ensure that it will always work on either your end or our end.  If the connection with a video visit is poor, we may have to switch to a telephone visit.  With either a video or telephone visit, we are not always able to ensure that we have a secure connection.   I need to obtain your verbal consent now.   Are you willing to proceed with your visit today?   KENLEY RETTINGER has provided verbal consent on 05/02/2020 for a virtual visit (video or telephone).   Donnal Moat, PA-C 05/02/2020  11:13 AM

## 2020-05-04 ENCOUNTER — Telehealth: Payer: Self-pay | Admitting: Physician Assistant

## 2020-05-04 ENCOUNTER — Other Ambulatory Visit: Payer: Self-pay

## 2020-05-04 MED ORDER — BUPROPION HCL ER (XL) 150 MG PO TB24: ORAL_TABLET | ORAL | 0 refills | Status: DC

## 2020-05-04 NOTE — Telephone Encounter (Signed)
Patient had just picked up #90 of 300 mg Wellbutrin. She only needs #90 of the 150 mg tablet to equal 450 mg. Rx updated and patient aware

## 2020-05-04 NOTE — Telephone Encounter (Signed)
Lori Cunningham called because the pharmacy is telling her they have a question about the dosing of her Wellbutrin and won't fill the prescription until they get the clarification from the dr.  Please call the pharmacy to clarify their questions so she can pick up her medication today.  [facility] on [address] in [place].

## 2020-07-12 ENCOUNTER — Other Ambulatory Visit: Payer: Self-pay | Admitting: Physician Assistant

## 2020-11-05 ENCOUNTER — Telehealth (INDEPENDENT_AMBULATORY_CARE_PROVIDER_SITE_OTHER): Payer: BLUE CROSS/BLUE SHIELD | Admitting: Physician Assistant

## 2020-11-05 ENCOUNTER — Encounter: Payer: Self-pay | Admitting: Physician Assistant

## 2020-11-05 DIAGNOSIS — F3342 Major depressive disorder, recurrent, in full remission: Secondary | ICD-10-CM | POA: Diagnosis not present

## 2020-11-05 DIAGNOSIS — F411 Generalized anxiety disorder: Secondary | ICD-10-CM | POA: Diagnosis not present

## 2020-11-05 MED ORDER — BUPROPION HCL ER (XL) 150 MG PO TB24: 450.0000 mg | ORAL_TABLET | Freq: Every day | ORAL | 3 refills | Status: AC

## 2020-11-05 MED ORDER — ALPRAZOLAM 0.25 MG PO TABS: ORAL_TABLET | ORAL | 5 refills | Status: AC

## 2020-11-05 NOTE — Progress Notes (Signed)
[facility]  Patient ID: Lori Cunningham,  MRN: [id number]  PCP: Burnis Medin, MD  Date of Evaluation: 11/05/2020 Time spent:25 minutes  Chief Complaint:  Chief Complaint   Follow-up; Anxiety; Depression     Virtual Visit via Telehealth  I connected with patient by telephone, with their informed consent, and verified patient privacy and that I am speaking with the correct person using two identifiers.  I am private, in my office and the patient is at home.  I discussed the limitations, risks, security and privacy concerns of performing an evaluation and management service by telephone and the availability of in person appointments. I also discussed with the patient that there may be a patient responsible charge related to this service. The patient expressed understanding and agreed to proceed.   I discussed the assessment and treatment plan with the patient. The patient was provided an opportunity to ask questions and all were answered. The patient agreed with the plan and demonstrated an understanding of the instructions.   The patient was advised to call back or seek an in-person evaluation if the symptoms worsen or if the condition fails to improve as anticipated.  I provided 25 minutes of non-face-to-face time during this encounter.  HISTORY/CURRENT STATUS: HPI for routine med check.  Six months ago we increased Wellbutrin. It has been very helpful.  She went through a recent break-up and knows that the increase dose was helpful in that situation.  She is able to enjoy things.  In fact she is on the way to the beach right now.  Energy and motivation are good.  Not isolating.  Sleeps fine.  Work is going well.  She does have anxiety and the Xanax is helpful.  It is more so generalized anxiety, not panic attacks most of the time.  No suicidal or homicidal thoughts.  Patient denies increased energy with decreased need for sleep, no increased talkativeness, no racing  thoughts, no impulsivity or risky behaviors, no increased spending, no increased libido, no grandiosity, no increased irritability or anger, no paranoia, and no hallucinations.  Denies dizziness, syncope, seizures, numbness, tingling, tremor, tics, unsteady gait, slurred speech, confusion. Denies muscle or joint pain, stiffness, or dystonia.  Individual Medical History/ Review of Systems: Changes? 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  Allergies: Trazodone and nefazodone  Current Medications:  Current Outpatient Medications:    docusate sodium (COLACE) 100 MG capsule, Take by mouth. Take 2 by mouth at bedtime , Disp: , Rfl:    pantoprazole (PROTONIX) 40 MG tablet, Take by mouth., Disp: , Rfl:    Progesterone Micronized (PROGESTERONE PO), Take 150 mg on nights 7 thru 14 of cycle and 300 mg on nights 15 thru 28, Disp: , Rfl:    TIROSINT 75 MCG CAPS, Take 1 capsule by mouth daily., Disp: , Rfl:    ALPRAZolam (XANAX) 0.25 MG tablet, TAKE 1 TO 2 TABLETS BY MOUTH ONCE DAILY AS NEEDED FOR ANXIETY, Disp: 60 tablet, Rfl: 5   azithromycin (ZITHROMAX Z-PAK) 250 MG tablet, As directied, Disp: 6 each, Rfl: 0   buPROPion (WELLBUTRIN XL) 150 MG 24 hr tablet, Take 3 tablets (450 mg total) by mouth daily., Disp: 270 tablet, Rfl: 3   desonide (DESOWEN) 0.05 % ointment, Apply 1 application topically 2 (two) times daily. (Patient not taking: No sig reported), Disp: 15 g, Rfl: 0   estradiol (VIVELLE-DOT) 0.05 MG/24HR patch, , Disp: , Rfl: 0  montelukast (SINGULAIR) 10 MG tablet, Take 10 mg by mouth at bedtime. (Patient not taking: No sig reported), Disp: , Rfl:    NATURE-THROID 195 MG tablet, , Disp: , Rfl: 1   NONFORMULARY OR COMPOUNDED ITEM, 3 application daily. Estriol/testosterone HRT 0.6-2 mg/GM (Patient not taking: Reported on 05/02/2020), Disp: , Rfl:    promethazine-dextromethorphan (PROMETHAZINE-DM) 6.25-15 MG/5ML syrup, Take 5 mLs by mouth 4 (four)  times daily as needed. (Patient not taking: No sig reported), Disp: 118 mL, Rfl: 0   valACYclovir (VALTREX) 1000 MG tablet, Take 1 tablet (1,000 mg total) by mouth 3 (three) times daily. (Patient not taking: No sig reported), Disp: 21 tablet, Rfl: 0 Medication Side Effects: none  Family Medical/ Social History: Changes? No  MENTAL HEALTH EXAM:  Last menstrual period 11/13/2012.There is no height or weight on file to calculate BMI.  General Appearance:  unable to assess  Eye Contact:   unable to assess  Speech:  Clear and Coherent  Volume:  Normal  Mood:  Euthymic  Affect:   unable to assess  Thought Process:  Goal Directed and Descriptions of Associations: Intact  Orientation:  Full (Time, Place, and Person)  Thought Content: Logical   Suicidal Thoughts:  No  Homicidal Thoughts:  No  Memory:  WNL  Judgement:  Good  Insight:  Good  Psychomotor Activity:   unable to assess  Concentration:  Concentration: Good and Attention Span: Good  Recall:  Good  Fund of Knowledge: Good  Language: Good  Assets:  Desire for Improvement  ADL's:  Intact  Cognition: WNL  Prognosis:  Good    DIAGNOSES:    ICD-10-CM   1. Recurrent major depressive disorder, in full remission ([place])  F33.42     2. Generalized anxiety disorder  F41.1        Receiving Psychotherapy: No    RECOMMENDATIONS:  PDMP reviewed.   I provided 20 minutes of nonface-to-face time during this encounter including time spent before and after the visit in records review and charting. I am glad to know she is doing better with the current treatment.  No changes are necessary. Our office is out of network for her so she pays out of pocket for these visits.  We have been doing annual checks, not every 6 months.  I am fine with that as long as she knows to call if she has any trouble in the meantime.  She understands. Continue Wellbutrin XL 150 mg, 3 p.o. every morning. Continue Xanax 0.25 mg, 1/2-1 twice daily as  needed. Return in 1 year.  Donnal Moat, PA-C

## 2021-09-09 ENCOUNTER — Ambulatory Visit: Payer: BLUE CROSS/BLUE SHIELD | Admitting: Dermatology

## 2021-09-09 ENCOUNTER — Encounter: Payer: Self-pay | Admitting: Dermatology

## 2021-09-09 DIAGNOSIS — D239 Other benign neoplasm of skin, unspecified: Secondary | ICD-10-CM

## 2021-09-09 DIAGNOSIS — L820 Inflamed seborrheic keratosis: Secondary | ICD-10-CM | POA: Diagnosis not present

## 2021-09-09 DIAGNOSIS — L821 Other seborrheic keratosis: Secondary | ICD-10-CM | POA: Diagnosis not present

## 2021-09-09 DIAGNOSIS — Z1283 Encounter for screening for malignant neoplasm of skin: Secondary | ICD-10-CM | POA: Diagnosis not present

## 2021-09-09 DIAGNOSIS — D2372 Other benign neoplasm of skin of left lower limb, including hip: Secondary | ICD-10-CM

## 2021-09-09 NOTE — Progress Notes (Signed)
? ?  Follow-Up Visit ?  ?Subjective  ?Lori Cunningham is a 56 y.o. female who presents for the following: Annual Exam (Full body exam. Lesion on left thigh x 2 years. Fungus on the toe. No family or personal history of non mole skin cancers, atypical moles, or melanoma. ). ? ?Skin check ?Location:  ?Duration:  ?Quality:  ?Associated Signs/Symptoms: ?Modifying Factors:  ?Severity:  ?Timing: ?Context:  ? ?Objective  ?Well appearing patient in no apparent distress; mood and affect are within normal limits. ?Full body exam ? ? ? ?A full examination was performed including scalp, head, eyes, ears, nose, lips, neck, chest, axillae, abdomen, back, buttocks, bilateral upper extremities, bilateral lower extremities, hands, feet, fingers, toes, fingernails, and toenails. All findings within normal limits unless otherwise noted below.  Areas beneath undergarments not fully examined. ? ? ?Assessment & Plan  ? ? ?Encounter for screening for malignant neoplasm of skin ? ?Dermatofibroma ?Left Thigh - Posterior ? ?Seborrheic keratosis ?Umbilicus ? ?Seborrheic keratosis, inflamed (2) ?Right Shoulder - Posterior; Right Upper Back ? ?Destruction of lesion - Right Shoulder - Posterior, Right Upper Back ?Complexity: simple   ?Destruction method: cryotherapy   ?Informed consent: discussed and consent obtained   ?Timeout:  patient name, date of birth, surgical site, and procedure verified ?Lesion destroyed using liquid nitrogen: Yes   ?Cryotherapy cycles:  3 ?Outcome: patient tolerated procedure well with no complications   ?Post-procedure details: wound care instructions given   ? ? ? ? ? ?I, Lavonna Monarch, MD, have reviewed all documentation for this visit.  The documentation on 09/09/21 for the exam, diagnosis, procedures, and orders are all accurate and complete. ?
